# Patient Record
Sex: Female | Born: 1935
Health system: Southern US, Community
[De-identification: ages and names within clinical notes are randomized; demographics above are authoritative.]

## PROBLEM LIST (undated history)

## (undated) DIAGNOSIS — F32A Depression, unspecified: Secondary | ICD-10-CM

## (undated) DIAGNOSIS — F329 Major depressive disorder, single episode, unspecified: Secondary | ICD-10-CM

## (undated) DIAGNOSIS — M1990 Unspecified osteoarthritis, unspecified site: Secondary | ICD-10-CM

## (undated) DIAGNOSIS — I10 Essential (primary) hypertension: Secondary | ICD-10-CM

## (undated) DIAGNOSIS — E041 Nontoxic single thyroid nodule: Secondary | ICD-10-CM

## (undated) DIAGNOSIS — F419 Anxiety disorder, unspecified: Secondary | ICD-10-CM

## (undated) DIAGNOSIS — E785 Hyperlipidemia, unspecified: Secondary | ICD-10-CM

## (undated) DIAGNOSIS — I4891 Unspecified atrial fibrillation: Secondary | ICD-10-CM

## (undated) DIAGNOSIS — H9190 Unspecified hearing loss, unspecified ear: Secondary | ICD-10-CM

## (undated) DIAGNOSIS — K219 Gastro-esophageal reflux disease without esophagitis: Secondary | ICD-10-CM

## (undated) DIAGNOSIS — T7840XA Allergy, unspecified, initial encounter: Secondary | ICD-10-CM

## (undated) DIAGNOSIS — Z972 Presence of dental prosthetic device (complete) (partial): Secondary | ICD-10-CM

## (undated) DIAGNOSIS — E119 Type 2 diabetes mellitus without complications: Secondary | ICD-10-CM

## (undated) DIAGNOSIS — I509 Heart failure, unspecified: Secondary | ICD-10-CM

## (undated) HISTORY — DX: Essential (primary) hypertension: I10

## (undated) HISTORY — DX: Unspecified atrial fibrillation: I48.91

## (undated) HISTORY — DX: Depression, unspecified: F32.A

## (undated) HISTORY — DX: Allergy, unspecified, initial encounter: T78.40XA

## (undated) HISTORY — DX: Hyperlipidemia, unspecified: E78.5

## (undated) HISTORY — DX: Heart failure, unspecified: I50.9

## (undated) HISTORY — DX: Anxiety disorder, unspecified: F41.9

## (undated) HISTORY — DX: Gastro-esophageal reflux disease without esophagitis: K21.9

## (undated) HISTORY — DX: Major depressive disorder, single episode, unspecified: F32.9

---

## 2004-01-17 HISTORY — PX: COLONOSCOPY: SHX174

## 2004-05-14 ENCOUNTER — Emergency Department: Payer: Self-pay | Admitting: Emergency Medicine

## 2004-06-22 ENCOUNTER — Ambulatory Visit: Payer: Self-pay | Admitting: Gastroenterology

## 2004-11-01 ENCOUNTER — Ambulatory Visit: Payer: Self-pay | Admitting: Family Medicine

## 2005-02-08 ENCOUNTER — Other Ambulatory Visit: Payer: Self-pay

## 2005-02-08 ENCOUNTER — Emergency Department: Payer: Self-pay | Admitting: Emergency Medicine

## 2006-03-23 ENCOUNTER — Emergency Department: Payer: Self-pay | Admitting: Emergency Medicine

## 2006-04-02 ENCOUNTER — Emergency Department: Payer: Self-pay | Admitting: Emergency Medicine

## 2008-09-16 ENCOUNTER — Ambulatory Visit: Payer: Self-pay | Admitting: Otolaryngology

## 2008-10-06 ENCOUNTER — Ambulatory Visit: Payer: Self-pay | Admitting: Otolaryngology

## 2009-08-24 ENCOUNTER — Ambulatory Visit: Payer: Self-pay | Admitting: Family Medicine

## 2009-08-30 ENCOUNTER — Ambulatory Visit: Payer: Self-pay | Admitting: Family Medicine

## 2010-11-30 ENCOUNTER — Ambulatory Visit: Payer: Self-pay | Admitting: Family Medicine

## 2011-03-06 DIAGNOSIS — E041 Nontoxic single thyroid nodule: Secondary | ICD-10-CM | POA: Insufficient documentation

## 2011-03-06 DIAGNOSIS — Z87891 Personal history of nicotine dependence: Secondary | ICD-10-CM | POA: Insufficient documentation

## 2011-03-06 DIAGNOSIS — M19071 Primary osteoarthritis, right ankle and foot: Secondary | ICD-10-CM | POA: Insufficient documentation

## 2011-03-06 DIAGNOSIS — R42 Dizziness and giddiness: Secondary | ICD-10-CM | POA: Insufficient documentation

## 2011-03-06 DIAGNOSIS — E785 Hyperlipidemia, unspecified: Secondary | ICD-10-CM | POA: Insufficient documentation

## 2011-03-06 DIAGNOSIS — I480 Paroxysmal atrial fibrillation: Secondary | ICD-10-CM | POA: Insufficient documentation

## 2011-03-06 DIAGNOSIS — F5104 Psychophysiologic insomnia: Secondary | ICD-10-CM | POA: Insufficient documentation

## 2011-03-06 DIAGNOSIS — F32A Depression, unspecified: Secondary | ICD-10-CM | POA: Insufficient documentation

## 2013-12-16 ENCOUNTER — Ambulatory Visit: Payer: Self-pay

## 2014-01-01 ENCOUNTER — Emergency Department: Payer: Self-pay | Admitting: Emergency Medicine

## 2014-01-01 ENCOUNTER — Ambulatory Visit: Payer: Self-pay

## 2014-01-01 LAB — CBC
HCT: 41.2 % (ref 35.0–47.0)
HGB: 13.4 g/dL (ref 12.0–16.0)
MCH: 30.2 pg (ref 26.0–34.0)
MCHC: 32.6 g/dL (ref 32.0–36.0)
MCV: 93 fL (ref 80–100)
Platelet: 218 10*3/uL (ref 150–440)
RBC: 4.45 10*6/uL (ref 3.80–5.20)
RDW: 13.3 % (ref 11.5–14.5)
WBC: 9.1 10*3/uL (ref 3.6–11.0)

## 2014-01-01 LAB — BASIC METABOLIC PANEL
Anion Gap: 8 (ref 7–16)
BUN: 10 mg/dL (ref 7–18)
Calcium, Total: 8.9 mg/dL (ref 8.5–10.1)
Chloride: 103 mmol/L (ref 98–107)
Co2: 28 mmol/L (ref 21–32)
Creatinine: 0.83 mg/dL (ref 0.60–1.30)
EGFR (African American): >60
EGFR (Non-African Amer.): >60
Glucose: 119 mg/dL — ABNORMAL HIGH (ref 65–99)
Osmolality: 278 (ref 275–301)
Potassium: 3.9 mmol/L (ref 3.5–5.1)
Sodium: 139 mmol/L (ref 136–145)

## 2014-01-01 LAB — TROPONIN I: Troponin-I: <0.02 ng/mL

## 2014-01-01 LAB — TSH: Thyroid Stimulating Horm: 1.04 u[IU]/mL

## 2014-01-07 DIAGNOSIS — I5189 Other ill-defined heart diseases: Secondary | ICD-10-CM | POA: Insufficient documentation

## 2014-10-22 ENCOUNTER — Encounter: Payer: Self-pay | Admitting: Family Medicine

## 2014-10-22 ENCOUNTER — Ambulatory Visit (INDEPENDENT_AMBULATORY_CARE_PROVIDER_SITE_OTHER): Payer: Medicare Other | Admitting: Family Medicine

## 2014-10-22 VITALS — BP 110/62 | HR 64 | Ht 64.0 in | Wt 184.0 lb

## 2014-10-22 DIAGNOSIS — J0110 Acute frontal sinusitis, unspecified: Secondary | ICD-10-CM | POA: Diagnosis not present

## 2014-10-22 DIAGNOSIS — Z23 Encounter for immunization: Secondary | ICD-10-CM | POA: Diagnosis not present

## 2014-10-22 MED ORDER — AMOXICILLIN 500 MG PO CAPS: 500.0000 mg | ORAL_CAPSULE | Freq: Three times a day (TID) | ORAL | Status: DC

## 2014-10-22 NOTE — Progress Notes (Signed)
Name: Kristina Wagner   MRN: [id number]    DOB: 1934/11/09   Date:10/22/2014       Progress Note  Subjective  Chief Complaint  Chief Complaint  Patient presents with  . Sinusitis    head is full and has head pressure    Sinusitis This is a recurrent problem. The current episode started in the past 7 days. The problem has been gradually worsening since onset. There has been no fever. Her pain is at a severity of 2/10. The pain is mild. Associated symptoms include congestion, headaches, neck pain and sinus pressure. Pertinent negatives include no chills, coughing, diaphoresis, ear pain, hoarse voice, shortness of breath, sneezing, sore throat or swollen glands. Past treatments include acetaminophen and spray decongestants (mucinex d). The treatment provided no relief.    No problem-specific assessment & plan notes found for this encounter.   Past Medical History  Diagnosis Date  . Hyperlipidemia   . Hypertension   . Atrial fibrillation (HCC)   . GERD (gastroesophageal reflux disease)   . Depression   . Allergy   . Anxiety   . CHF (congestive heart failure) [facility])     Past Surgical History  Procedure Laterality Date  . Colonoscopy  2006    had 2 polyps- benign    Family History  Problem Relation Age of Onset  . Heart disease Mother     Social History   Social History  . Marital Status: Married    Spouse Name: N/A  . Number of Children: N/A  . Years of Education: N/A   Occupational History  . Not on file.   Social History Main Topics  . Smoking status: Former Games developer  . Smokeless tobacco: Not on file  . Alcohol Use: No  . Drug Use: No  . Sexual Activity: No   Other Topics Concern  . Not on file   Social History Narrative  . No narrative on file    No Known Allergies   Review of Systems  Constitutional: Negative for fever, chills, weight loss, malaise/fatigue and diaphoresis.  HENT: Positive for congestion and sinus pressure. Negative for ear  discharge, ear pain, hoarse voice, sneezing and sore throat.   Eyes: Negative for blurred vision.  Respiratory: Negative for cough, sputum production, shortness of breath and wheezing.   Cardiovascular: Negative for chest pain, palpitations and leg swelling.  Gastrointestinal: Negative for heartburn, nausea, abdominal pain, diarrhea, constipation, blood in stool and melena.  Genitourinary: Negative for dysuria, urgency, frequency and hematuria.  Musculoskeletal: Positive for neck pain. Negative for myalgias, back pain and joint pain.  Skin: Negative for rash.  Neurological: Positive for headaches. Negative for dizziness, tingling, sensory change and focal weakness.  Endo/Heme/Allergies: Negative for environmental allergies and polydipsia. Does not bruise/bleed easily.  Psychiatric/Behavioral: Negative for depression and suicidal ideas. The patient is not nervous/anxious and does not have insomnia.      Objective  Filed Vitals:   10/22/14 1537  BP: 110/62  Pulse: 64  Height:  (1.626 m)  Weight: 184 lb (83.462 kg)    Physical Exam  Constitutional: She is well-developed, well-nourished, and in no distress. No distress.  HENT:  Head: Normocephalic and atraumatic.  Right Ear: External ear normal.  Left Ear: External ear normal.  Nose: Nose normal.  Mouth/Throat: Oropharynx is clear and moist.  Eyes: Conjunctivae and EOM are normal. Pupils are equal, round, and reactive to light. Right eye exhibits no discharge. Left eye exhibits no discharge.  Neck: Normal range  of motion. Neck supple. No JVD present. No thyromegaly present.  Cardiovascular: Normal rate, regular rhythm, normal heart sounds and intact distal pulses.  Exam reveals no gallop and no friction rub.   No murmur heard. Pulmonary/Chest: Effort normal and breath sounds normal.  Abdominal: Soft. Bowel sounds are normal. She exhibits no mass. There is no tenderness. There is no guarding.  Musculoskeletal: Normal range of  motion. She exhibits no edema.  Lymphadenopathy:    She has no cervical adenopathy.  Neurological: She is alert. She has normal reflexes.  Skin: Skin is warm and dry. She is not diaphoretic.  Psychiatric: Mood and affect normal.  Nursing note and vitals reviewed.     Assessment & Plan  Problem List Items Addressed This Visit    None    Visit Diagnoses    Acute frontal sinusitis, recurrence not specified    -  Primary    nasonex sample    Relevant Medications    loratadine (CLARITIN) 10 MG tablet    amoxicillin (AMOXIL) 500 MG capsule    Need for influenza vaccination        Relevant Orders    Flu Vaccine QUAD 36+ mos PF IM (Fluarix & Fluzone Quad PF) (Completed)    Need for pneumococcal vaccination        Relevant Orders    Pneumococcal conjugate vaccine 13-valent (Completed)         Dr. Hayden [facility]  10/22/2014

## 2014-11-04 ENCOUNTER — Ambulatory Visit (INDEPENDENT_AMBULATORY_CARE_PROVIDER_SITE_OTHER): Payer: Medicare Other | Admitting: Family Medicine

## 2014-11-04 ENCOUNTER — Encounter: Payer: Self-pay | Admitting: Family Medicine

## 2014-11-04 VITALS — BP 110/62 | HR 84 | Ht 64.0 in | Wt 184.0 lb

## 2014-11-04 DIAGNOSIS — F329 Major depressive disorder, single episode, unspecified: Secondary | ICD-10-CM | POA: Diagnosis not present

## 2014-11-04 DIAGNOSIS — J302 Other seasonal allergic rhinitis: Secondary | ICD-10-CM

## 2014-11-04 DIAGNOSIS — R609 Edema, unspecified: Secondary | ICD-10-CM | POA: Diagnosis not present

## 2014-11-04 DIAGNOSIS — E785 Hyperlipidemia, unspecified: Secondary | ICD-10-CM | POA: Diagnosis not present

## 2014-11-04 DIAGNOSIS — I10 Essential (primary) hypertension: Secondary | ICD-10-CM | POA: Diagnosis not present

## 2014-11-04 DIAGNOSIS — F32A Depression, unspecified: Secondary | ICD-10-CM

## 2014-11-04 MED ORDER — LEVOCETIRIZINE DIHYDROCHLORIDE 5 MG PO TABS: 5.0000 mg | ORAL_TABLET | Freq: Every evening | ORAL | Status: DC

## 2014-11-04 MED ORDER — FUROSEMIDE 20 MG PO TABS: 20.0000 mg | ORAL_TABLET | Freq: Every day | ORAL | Status: DC

## 2014-11-04 MED ORDER — METOPROLOL TARTRATE 100 MG PO TABS: 100.0000 mg | ORAL_TABLET | Freq: Two times a day (BID) | ORAL | Status: DC

## 2014-11-04 MED ORDER — VENLAFAXINE HCL 75 MG PO TABS: 75.0000 mg | ORAL_TABLET | Freq: Every day | ORAL | Status: DC

## 2014-11-04 MED ORDER — LOSARTAN POTASSIUM 100 MG PO TABS: 100.0000 mg | ORAL_TABLET | Freq: Every day | ORAL | Status: DC

## 2014-11-04 MED ORDER — SIMVASTATIN 40 MG PO TABS: 40.0000 mg | ORAL_TABLET | Freq: Every day | ORAL | Status: DC

## 2014-11-04 NOTE — Progress Notes (Signed)
Name: Kristina Wagner   MRN: [id number]    DOB: 1934/10/26   Date:11/04/2014       Progress Note  Subjective  Chief Complaint  Chief Complaint  Patient presents with  . Hyperlipidemia  . Hypertension  . Depression  . Allergic Rhinitis     Hyperlipidemia This is a chronic problem. The current episode started more than 1 month ago. The problem is controlled. Recent lipid tests were reviewed and are normal. She has no history of chronic renal disease, diabetes, hypothyroidism, liver disease, obesity or nephrotic syndrome. Factors aggravating her hyperlipidemia include beta blockers. Pertinent negatives include no chest pain, focal sensory loss, focal weakness, leg pain, myalgias or shortness of breath. Current antihyperlipidemic treatment includes statins. The current treatment provides moderate improvement of lipids. There are no compliance problems.  Risk factors for coronary artery disease include diabetes mellitus and dyslipidemia.  Hypertension This is a chronic problem. The current episode started more than 1 year ago. The problem has been gradually improving since onset. The problem is controlled. Pertinent negatives include no anxiety, blurred vision, chest pain, headaches, malaise/fatigue, neck pain, orthopnea, palpitations, peripheral edema, PND, shortness of breath or sweats. There are no associated agents to hypertension. There are no known risk factors for coronary artery disease. Past treatments include beta blockers, calcium channel blockers and angiotensin blockers. The current treatment provides no improvement. There are no compliance problems.  There is no history of angina, kidney disease, CAD/MI, CVA, heart failure, left ventricular hypertrophy, PVD, renovascular disease or retinopathy. There is no history of chronic renal disease.  Depression        This is a chronic problem.  The current episode started more than 1 year ago.   The onset quality is gradual.   The problem occurs  intermittently.  The problem has been gradually improving since onset.  Associated symptoms include no decreased concentration, no fatigue, no helplessness, no hopelessness, does not have insomnia, not irritable, no myalgias, no headaches, not sad and no suicidal ideas.     The symptoms are aggravated by nothing.  Past treatments include SSRIs - Selective serotonin reuptake inhibitors.   Pertinent negatives include no hypothyroidism and no anxiety. Gastroesophageal Reflux She reports no abdominal pain, no chest pain, no coughing, no heartburn, no nausea, no sore throat or no wheezing. This is a chronic problem. The current episode started more than 1 year ago. Pertinent negatives include no fatigue, melena or weight loss. She has tried a histamine-2 antagonist for the symptoms.    No problem-specific assessment & plan notes found for this encounter.   Past Medical History  Diagnosis Date  . Hyperlipidemia   . Hypertension   . Atrial fibrillation (HCC)   . GERD (gastroesophageal reflux disease)   . Depression   . Allergy   . Anxiety   . CHF (congestive heart failure) [facility])     Past Surgical History  Procedure Laterality Date  . Colonoscopy  2006    had 2 polyps- benign    Family History  Problem Relation Age of Onset  . Heart disease Mother     Social History   Social History  . Marital Status: Married    Spouse Name: N/A  . Number of Children: N/A  . Years of Education: N/A   Occupational History  . Not on file.   Social History Main Topics  . Smoking status: Former Games developer  . Smokeless tobacco: Not on file  . Alcohol Use: No  . Drug Use:  No  . Sexual Activity: No   Other Topics Concern  . Not on file   Social History Narrative    No Known Allergies   Review of Systems  Constitutional: Negative for fever, chills, weight loss, malaise/fatigue and fatigue.  HENT: Negative for ear discharge, ear pain and sore throat.   Eyes: Negative for blurred vision.   Respiratory: Negative for cough, sputum production, shortness of breath and wheezing.   Cardiovascular: Negative for chest pain, palpitations, orthopnea, leg swelling and PND.  Gastrointestinal: Negative for heartburn, nausea, abdominal pain, diarrhea, constipation, blood in stool and melena.  Genitourinary: Negative for dysuria, urgency, frequency and hematuria.  Musculoskeletal: Negative for myalgias, back pain, joint pain and neck pain.  Skin: Negative for rash.  Neurological: Negative for dizziness, tingling, sensory change, focal weakness and headaches.  Endo/Heme/Allergies: Negative for environmental allergies and polydipsia. Does not bruise/bleed easily.  Psychiatric/Behavioral: Positive for depression. Negative for suicidal ideas and decreased concentration. The patient is not nervous/anxious and does not have insomnia.      Objective  Filed Vitals:   11/04/14 0915  BP: 110/62  Pulse: 84  Height:  (1.626 m)  Weight: 184 lb (83.462 kg)    Physical Exam  Constitutional: She is well-developed, well-nourished, and in no distress. She is not irritable. No distress.  HENT:  Head: Normocephalic and atraumatic.  Right Ear: External ear normal.  Left Ear: External ear normal.  Nose: Nose normal.  Mouth/Throat: Oropharynx is clear and moist.  Eyes: Conjunctivae and EOM are normal. Pupils are equal, round, and reactive to light. Right eye exhibits no discharge. Left eye exhibits no discharge.  Neck: Normal range of motion. Neck supple. No JVD present. No thyromegaly present.  Cardiovascular: Normal rate, regular rhythm, normal heart sounds and intact distal pulses.  Exam reveals no gallop and no friction rub.   No murmur heard. Pulmonary/Chest: Effort normal and breath sounds normal.  Abdominal: Soft. Bowel sounds are normal. She exhibits no mass. There is no tenderness. There is no guarding.  Musculoskeletal: Normal range of motion. She exhibits no edema.  Lymphadenopathy:     She has no cervical adenopathy.  Neurological: She is alert. She has normal reflexes.  Skin: Skin is warm and dry. She is not diaphoretic.  Psychiatric: Mood and affect normal.      Assessment & Plan  Problem List Items Addressed This Visit    None    Visit Diagnoses    Essential hypertension    -  Primary    Relevant Medications    simvastatin (ZOCOR) 40 MG tablet    losartan (COZAAR) 100 MG tablet    furosemide (LASIX) 20 MG tablet    metoprolol (LOPRESSOR) 100 MG tablet    Other Relevant Orders    Renal Function Panel    Other seasonal allergic rhinitis        Relevant Medications    levocetirizine (XYZAL) 5 MG tablet    Other Relevant Orders    Lipid Profile    Hyperlipidemia        Relevant Medications    simvastatin (ZOCOR) 40 MG tablet    losartan (COZAAR) 100 MG tablet    furosemide (LASIX) 20 MG tablet    metoprolol (LOPRESSOR) 100 MG tablet    Depression        Relevant Medications    venlafaxine (EFFEXOR) 75 MG tablet    Edema, unspecified type        Relevant Medications    furosemide (LASIX)  20 MG tablet         Dr. Hayden [facility]  11/04/2014

## 2014-11-05 LAB — RENAL FUNCTION PANEL
Albumin: 4.2 g/dL (ref 3.5–4.8)
BUN/Creatinine Ratio: 21 (ref 11–26)
BUN: 15 mg/dL (ref 8–27)
CO2: 26 mmol/L (ref 18–29)
Calcium: 9.4 mg/dL (ref 8.7–10.3)
Chloride: 96 mmol/L — ABNORMAL LOW (ref 97–106)
Creatinine, Ser: 0.72 mg/dL (ref 0.57–1.00)
GFR calc Af Amer: 93 mL/min/{1.73_m2} (ref >59)
GFR calc non Af Amer: 80 mL/min/{1.73_m2} (ref >59)
Glucose: 151 mg/dL — ABNORMAL HIGH (ref 65–99)
Phosphorus: 3.9 mg/dL (ref 2.5–4.5)
Potassium: 4.7 mmol/L (ref 3.5–5.2)
Sodium: 139 mmol/L (ref 136–144)

## 2014-11-05 LAB — LIPID PANEL
Chol/HDL Ratio: 3.2 ratio units (ref 0.0–4.4)
Cholesterol, Total: 193 mg/dL (ref 100–199)
HDL: 61 mg/dL (ref >39)
LDL Calculated: 103 mg/dL — ABNORMAL HIGH (ref 0–99)
Triglycerides: 146 mg/dL (ref 0–149)
VLDL Cholesterol Cal: 29 mg/dL (ref 5–40)

## 2014-11-24 ENCOUNTER — Other Ambulatory Visit: Payer: Self-pay

## 2014-11-24 DIAGNOSIS — J019 Acute sinusitis, unspecified: Secondary | ICD-10-CM

## 2014-11-24 MED ORDER — AMOXICILLIN 500 MG PO CAPS: 500.0000 mg | ORAL_CAPSULE | Freq: Three times a day (TID) | ORAL | Status: DC

## 2015-02-15 ENCOUNTER — Encounter: Payer: Self-pay | Admitting: Family Medicine

## 2015-02-15 ENCOUNTER — Ambulatory Visit (INDEPENDENT_AMBULATORY_CARE_PROVIDER_SITE_OTHER): Payer: PPO | Admitting: Family Medicine

## 2015-02-15 VITALS — BP 124/70 | HR 78 | Ht 64.0 in | Wt 191.0 lb

## 2015-02-15 DIAGNOSIS — S46111A Strain of muscle, fascia and tendon of long head of biceps, right arm, initial encounter: Secondary | ICD-10-CM | POA: Diagnosis not present

## 2015-02-15 DIAGNOSIS — Z1231 Encounter for screening mammogram for malignant neoplasm of breast: Secondary | ICD-10-CM

## 2015-02-15 DIAGNOSIS — S46211A Strain of muscle, fascia and tendon of other parts of biceps, right arm, initial encounter: Secondary | ICD-10-CM

## 2015-02-15 NOTE — Patient Instructions (Signed)
Biceps Tendon Disruption (Proximal) With Rehab The biceps tendon attaches the biceps muscle to the bones of the elbow and the shoulder. A proximal biceps tendon disruption is a tear of the long head of the tendon that attaches near the shoulder (more common) or a tear in the muscle near the muscle tendon junction (less common). These injuries usually involve a complete tear of the tendon from the bone. However, partial tears are also possible. The biceps muscle works with other muscles to bend the elbow and rotate the palm upward (supinate). A complete biceps rupture will result in about a 10% decrease in elbow bending strength and a 20% decrease in your ability to turn the palm upward, using the wrist. SYMPTOMS   Pain, tenderness, swelling, warmth, or redness over the front of the shoulder.  Pain that gets worse with shoulder and elbow use, especially against resistance (lifting or carrying).  Bruising (contusion) in the arm or elbow after 24 to 48 hours.  Bulge can be seen and felt in the arm.  Limited motion of the shoulder or elbow.  Weakness with attempted elbow bending or rotation of the wrist, such as when using a screwdriver.  Crackling sound (crepitation) when the tendon or shoulder is moved or touched. CAUSES  A biceps tendon rupture occurs when the tendon is subjected to a force that is greater than it can withstand. For example, a sudden force straightening the elbow while the biceps is flexed, or a direct hit (trauma) (rare). RISK INCREASES WITH:   Sports that involve contact, or throwing and overhead activities (racquet sports, gymnastics, weightlifting, bodybuilding).  Heavy labor.  Poor strength and flexibility.  Failure to warm up properly before activity. PREVENTION  Warm up and stretch properly before activity.  Maintain physical fitness:  Strength, flexibility, and endurance.  Cardiovascular fitness.  Allow your body to recover between practices and  competition.  Learn and use proper exercise technique. PROGNOSIS  If treated properly, the symptoms of a proximal biceps tendon disruption usually go away within 12 weeks of injury.  RELATED COMPLICATIONS  Longer healing time if not properly treated, or if not given enough time to heal.  Recurring symptoms, especially if activity is resumed too soon.  Weakness of elbow bending and forearm rotation.  Prolonged disability (uncommon). TREATMENT Treatment first involves the use of ice and medicine, to reduce pain and inflammation. It is also important to perform strengthening and stretching exercises and to modify activities that cause symptoms to get worse. These exercises may be performed at home or with a therapist. It is not possible to surgically fix the tendon (sew it together). However, surgery may be performed to reinsert the tendon into the arm bone. This will make the arm look normal again. Surgery is most often advised for younger, active individuals, especially those who require strength of wrist rotation.  MEDICATION  If pain medicine is needed, nonsteroidal anti-inflammatory medicines (aspirin and ibuprofen), or other minor pain relievers (acetaminophen), are often advised.  Do not take pain medicine for 7 days before surgery.  Prescription pain relievers may be given if your caregiver thinks they are needed. Use only as directed and only as much as you need.  Corticosteroid injections may be given to help reduce inflammation, but are not usually recommended for this injury. HEAT AND COLD  Cold treatment (icing) should be applied for 10 to 15 minutes every 2 to 3 hours for inflammation and pain, and immediately after activity that aggravates your symptoms. Use ice packs   or an ice massage.  Heat treatment may be used before performing stretching and strengthening activities prescribed by your caregiver, physical therapist, or athletic trainer. Use a heat pack or a warm water  soak. SEEK MEDICAL CARE IF:   Symptoms get worse or do not improve in 2 weeks, despite treatment.  New, unexplained symptoms develop. (Drugs used in treatment may produce side effects.) EXERCISES RANGE OF MOTION (ROM) AND STRETCHING EXERCISES - Biceps Tendon Disruption (Proximal) These exercises may help you when beginning to rehabilitate your injury. Your symptoms may go away with or without further involvement from your physician, physical therapist or athletic trainer. While completing these exercises, remember:   Restoring tissue flexibility helps normal motion to return to the joints. This allows healthier, less painful movement and activity.  An effective stretch should be held for at least 30 seconds.  A stretch should never be painful. You should only feel a gentle lengthening or release in the stretched tissue. STRETCH - Flexion, Standing  Stand with good posture. With an underhand grip on your right / left hand and an overhand grip on the opposite hand, grasp a broomstick or cane so that your hands are a little more than shoulder width apart.  Keeping your right / left elbow straight and shoulder muscles relaxed, push the stick with your opposite hand to raise your right / left arm in front of your body and then overhead. Raise your arm until you feel a stretch in your right / left shoulder, but before you have increased shoulder pain.  Try to avoid shrugging your right / left shoulder as your arm rises by keeping your shoulder blade tucked down and toward your mid-back spine. Hold __________ seconds.  Slowly return to the starting position. Repeat __________ times. Complete this exercise __________ times per day. STRETCH - Abduction, Supine  Stand with good posture. With an underhand grip on your right / left hand and an overhand grip on the opposite hand, grasp a broomstick or cane so that your hands are a little more than shoulder-width apart.  Keeping your right / left  elbow straight and shoulder muscles relaxed, push the stick with your opposite hand to raise your right / left arm out to the side of your body and then overhead. Raise your arm until you feel a stretch in your right / left shoulder, but before you have increased shoulder pain.  Try to avoid shrugging your right / left shoulder as your arm rises, by keeping your shoulder blade tucked down and toward your mid-back spine. Hold for __________ seconds.  Slowly return to the starting position. Repeat __________ times. Complete this exercise __________ times per day. ROM - Flexion, Active-Assisted  Lie on your back. You may bend your knees for comfort.  Grasp a broomstick or cane so your hands are about shoulder width apart. Your right / left hand should grip the end of the stick so that your hand is positioned "thumbs-up," as if you were about to shake hands.  Using your healthy arm to lead, raise your right / left arm overhead until you feel a gentle stretch in your shoulder. Hold for right / left seconds.  Use the stick to assist in returning your right / left arm to its starting position. Repeat __________ times. Complete this exercise __________ times per day.  STRETCH - Flexion, Standing   Stand facing a wall. Walk your right / left fingers up the wall until you feel a moderate stretch in your   shoulder. As your hand gets higher, you may need to step closer to the wall or use a door frame to walk through.  Try to avoid shrugging your right / left shoulder as your arm rises, by keeping your shoulder blade tucked down and toward your mid-back spine.  Hold for __________ seconds. Use your other hand, if needed, to ease out of the stretch and return to the starting position. Repeat __________ times. Complete this exercise __________ times per day.  ROM - Internal Rotation   Using underhand grips, grasp a stick behind your back with both hands.  While standing upright with good posture, slide  the stick up your back until you feel a mild stretch in the front of your shoulder.  Hold for __________ seconds. Slowly return to your starting position. Repeat __________ times. Complete this exercise __________ times per day.  STRETCH - Internal Rotation  Place your right / left hand behind your back, palm-up.  Throw a towel or belt over your opposite shoulder. Grasp the towel with your right / left hand.  While keeping an upright posture, gently pull up on the towel until you feel a stretch in the front of your right / left shoulder.  Avoid shrugging your right / left shoulder as your arm rises, by keeping your shoulder blade tucked down and toward your mid-back spine.  Hold for __________ seconds. Release the stretch by lowering your opposite hand. Repeat __________ times. Complete this exercise __________ times per day. STRETCH - Elbow Flexors   Lie on a firm bed or countertop on your back. Be sure that you are in a comfortable position which will allow you to relax your arm muscles.  Place a folded towel under your right / left upper arm, so that your elbow and shoulder are at the same height. Extend your arm; your elbow should not rest on the bed or towel.  Allow the weight of your hand to straighten your elbow. Keep your arm and chest muscles relaxed. Your caregiver may ask you to increase the intensity of your stretch by adding a small wrist or hand weight.  Hold for __________ seconds. You should feel a stretch on the inside of your elbow. Slowly return to the starting position. Repeat __________ times. Complete this exercise __________ times per day. STRENGTHENING EXERCISES - Biceps Tendon Disruption (Proximal) These exercises may help you regain your strength after your physician has discontinued your restraint in a cast or brace. They may resolve your symptoms with or without further involvement from your physician, physical therapist or athletic trainer. While completing  these exercises, remember:   Muscles can gain both the endurance and the strength needed for everyday activities through controlled exercises.  Complete these exercises as instructed by your physician, physical therapist or athletic trainer. Progress the resistance and repetitions only as guided.  You may experience muscle soreness or fatigue, but the pain or discomfort you are trying to eliminate should never worsen during these exercises. If this pain does get worse, stop and make sure you are following the directions exactly. If the pain is still present after adjustments, discontinue the exercise until you can discuss the trouble with your clinician. STRENGTH - Elbow Flexors, Isometric   Stand or sit upright on a firm surface. Place your right / left arm so that your hand is palm-up and at the height of your waist.  Place your opposite hand on top of your forearm. Gently push down as your right / left arm   resists. Push as hard as you can with both arms, without causing any pain or movement at your right / left elbow. Hold this stationary position for __________ seconds.  Gradually release the tension in both arms. Allow your muscles to relax completely before repeating. Repeat __________ times. Complete this exercise __________ times per day. STRENGTH - Shoulder Flexion, Isometric  With good posture and facing a wall, stand or sit about 4-6 inches away.  Keeping your right / left elbow straight, gently press the top of your fist into the wall. Increase the pressure gradually until you are pressing as hard as you can without shrugging your shoulder or increasing any shoulder discomfort.  Hold for __________ seconds.  Release the tension slowly. Relax your shoulder muscles completely before you the next repetition. Repeat __________ times. Complete this exercise __________ times per day.  STRENGTH - Elbow Flexors, Supinated  With good posture, stand or sit on a firm chair without  armrests. Allow your right / left arm to rest at your side with your palm facing forward.  Holding a __________weight or gripping a rubber exercise band or tubing, bring your hand toward your shoulder.  Allow your muscles to control the resistance as your hand returns to your side. Repeat __________ times. Complete this exercise __________ times per day.  STRENGTH - Elbow Flexors, Neutral  With good posture, stand or sit on a firm chair without armrests. Allow your right / left arm to rest at your side with your thumb facing forward.  Holding a __________ weight or gripping a rubber exercise band or tubing, bring your hand toward your shoulder.  Allow your muscles to control the resistance as your hand returns to your side. Repeat __________ times. Complete this exercise __________ times per day.  STRENGTH - Shoulder Flexion   Stand or sit with good posture. Grasp a __________ weight, or an exercise band or tubing, so that your hand is "thumbs-up," like when you shake hands.  Slowly lift your right / left arm as far as you can without increasing any shoulder pain. Initially, many people can only raise their hand to shoulder height.  Avoid shrugging your right / left shoulder as your arm rises, by keeping your shoulder blade tucked down and toward your mid-back spine.  Hold for __________ seconds. Control the descent of your hand as you slowly return to your starting position. Repeat __________ times. Complete this exercise __________ times per day.  STRENGTH - Forearm Supinators   Sit with your right / left forearm supported on a table, keeping your elbow below shoulder height. Rest your hand over the edge, palm down.  Gently grip a hammer or a soup ladle.  Without moving your elbow, slowly turn your palm and hand upward to a "thumbs-up" position.  Hold this position for __________ seconds. Slowly return to the starting position. Repeat __________ times. Complete this exercise  __________ times per day.  STRENGTH - Forearm Pronators   Sit with your right / left forearm supported on a table, keeping your elbow below shoulder height. Rest your hand over the edge, palm up.  Gently grip a hammer or a soup ladle.  Without moving your elbow, slowly turn your palm and hand upward to a "thumbs-up" position.  Hold this position for __________ seconds. Slowly return to the starting position. Repeat __________ times. Complete this exercise __________ times per day.    This information is not intended to replace advice given to you by your health care provider. Make   sure you discuss any questions you have with your health care provider.   Document Released: 01/02/2005 Document Revised: 01/23/2014 Document Reviewed: 04/16/2008 Elsevier Interactive Patient Education 2016 [employer]  

## 2015-02-15 NOTE — Progress Notes (Signed)
Name: Kristina Wagner   MRN: [id number]    DOB: 09/21/1934   Date:02/15/2015       Progress Note  Subjective  Chief Complaint  Chief Complaint  Patient presents with  . Bleeding/Bruising    "was pulling on husband and felt something pop"    Arm Injury  The incident occurred 3 to 5 days ago. The incident occurred at home. Injury mechanism: pulling on immovable circumstance. The pain is present in the upper right arm. The quality of the pain is described as aching. The pain is at a severity of 2/10. The pain is moderate. The pain has been fluctuating since the incident. Pertinent negatives include no chest pain, muscle weakness, numbness or tingling. Nothing aggravates the symptoms. She has tried NSAIDs for the symptoms. The treatment provided no relief.    No problem-specific assessment & plan notes found for this encounter.   Past Medical History  Diagnosis Date  . Hyperlipidemia   . Hypertension   . Atrial fibrillation (HCC)   . GERD (gastroesophageal reflux disease)   . Depression   . Allergy   . Anxiety   . CHF (congestive heart failure) [facility])     Past Surgical History  Procedure Laterality Date  . Colonoscopy  2006    had 2 polyps- benign    Family History  Problem Relation Age of Onset  . Heart disease Mother     Social History   Social History  . Marital Status: Married    Spouse Name: N/A  . Number of Children: N/A  . Years of Education: N/A   Occupational History  . Not on file.   Social History Main Topics  . Smoking status: Former Games developer  . Smokeless tobacco: Not on file  . Alcohol Use: No  . Drug Use: No  . Sexual Activity: No   Other Topics Concern  . Not on file   Social History Narrative    No Known Allergies   Review of Systems  Constitutional: Negative for fever, chills, weight loss and malaise/fatigue.  HENT: Negative for ear discharge, ear pain and sore throat.   Eyes: Negative for blurred vision.  Respiratory: Negative for  cough, sputum production, shortness of breath and wheezing.   Cardiovascular: Negative for chest pain, palpitations and leg swelling.  Gastrointestinal: Negative for heartburn, nausea, abdominal pain, diarrhea, constipation, blood in stool and melena.  Genitourinary: Negative for dysuria, urgency, frequency and hematuria.  Musculoskeletal: Negative for myalgias, back pain, joint pain and neck pain.  Skin: Negative for rash.  Neurological: Negative for dizziness, tingling, sensory change, focal weakness, numbness and headaches.  Endo/Heme/Allergies: Negative for environmental allergies and polydipsia. Does not bruise/bleed easily.  Psychiatric/Behavioral: Negative for depression and suicidal ideas. The patient is not nervous/anxious and does not have insomnia.      Objective  Filed Vitals:   02/15/15 1336  BP: 124/70  Pulse: 78  Height:  (1.626 m)  Weight: 191 lb (86.637 kg)    Physical Exam  Constitutional: She is well-developed, well-nourished, and in no distress. No distress.  HENT:  Head: Normocephalic and atraumatic.  Right Ear: External ear normal.  Left Ear: External ear normal.  Nose: Nose normal.  Mouth/Throat: Oropharynx is clear and moist.  Eyes: Conjunctivae and EOM are normal. Pupils are equal, round, and reactive to light. Right eye exhibits no discharge. Left eye exhibits no discharge.  Neck: Normal range of motion. Neck supple. No JVD present. No thyromegaly present.  Cardiovascular: Normal rate, regular  rhythm, normal heart sounds and intact distal pulses.  Exam reveals no gallop and no friction rub.   No murmur heard. Pulmonary/Chest: Effort normal and breath sounds normal.  Abdominal: Soft. Bowel sounds are normal. She exhibits no mass. There is no tenderness. There is no guarding.  Musculoskeletal: Normal range of motion. She exhibits no edema.       Right upper arm: She exhibits tenderness, swelling and deformity.       Arms: ecchymosis    Lymphadenopathy:    She has no cervical adenopathy.  Neurological: She is alert. She has normal reflexes.  Skin: Skin is warm and dry. She is not diaphoretic.  Psychiatric: Mood and affect normal.  Nursing note and vitals reviewed.     Assessment & Plan  Problem List Items Addressed This Visit    None    Visit Diagnoses    Biceps muscle tear, right, initial encounter    -  Primary    proximal tendon rupture    Relevant Orders    Ambulatory referral to Orthopedic Surgery    Visit for screening mammogram        Relevant Orders    MM Digital Screening         Dr. Elizabeth Sauer [facility]  02/15/2015

## 2015-02-16 DIAGNOSIS — S46111A Strain of muscle, fascia and tendon of long head of biceps, right arm, initial encounter: Secondary | ICD-10-CM | POA: Diagnosis not present

## 2015-02-22 ENCOUNTER — Other Ambulatory Visit: Payer: Self-pay

## 2015-02-22 DIAGNOSIS — I10 Essential (primary) hypertension: Secondary | ICD-10-CM

## 2015-02-22 DIAGNOSIS — F329 Major depressive disorder, single episode, unspecified: Secondary | ICD-10-CM

## 2015-02-22 DIAGNOSIS — F32A Depression, unspecified: Secondary | ICD-10-CM

## 2015-02-22 MED ORDER — VENLAFAXINE HCL ER 75 MG PO CP24: 75.0000 mg | ORAL_CAPSULE | Freq: Every day | ORAL | Status: DC

## 2015-02-22 MED ORDER — LOSARTAN POTASSIUM 100 MG PO TABS: 100.0000 mg | ORAL_TABLET | Freq: Every day | ORAL | Status: DC

## 2015-02-24 ENCOUNTER — Ambulatory Visit
Admission: RE | Admit: 2015-02-24 | Discharge: 2015-02-24 | Disposition: A | Discharge status: Home / Self Care | Payer: PPO | Source: Ambulatory Visit | Attending: Family Medicine | Admitting: Family Medicine

## 2015-02-24 DIAGNOSIS — Z1231 Encounter for screening mammogram for malignant neoplasm of breast: Secondary | ICD-10-CM | POA: Diagnosis not present

## 2015-03-09 DIAGNOSIS — I519 Heart disease, unspecified: Secondary | ICD-10-CM | POA: Diagnosis not present

## 2015-03-09 DIAGNOSIS — I480 Paroxysmal atrial fibrillation: Secondary | ICD-10-CM | POA: Diagnosis not present

## 2015-06-02 DIAGNOSIS — F411 Generalized anxiety disorder: Secondary | ICD-10-CM | POA: Diagnosis not present

## 2015-06-02 DIAGNOSIS — F4322 Adjustment disorder with anxiety: Secondary | ICD-10-CM | POA: Diagnosis not present

## 2015-06-16 DIAGNOSIS — F411 Generalized anxiety disorder: Secondary | ICD-10-CM | POA: Diagnosis not present

## 2015-06-16 DIAGNOSIS — F4322 Adjustment disorder with anxiety: Secondary | ICD-10-CM | POA: Diagnosis not present

## 2015-09-06 ENCOUNTER — Other Ambulatory Visit: Payer: Self-pay

## 2015-09-06 DIAGNOSIS — I10 Essential (primary) hypertension: Secondary | ICD-10-CM

## 2015-09-06 MED ORDER — LOSARTAN POTASSIUM 100 MG PO TABS: 100.0000 mg | ORAL_TABLET | Freq: Every day | ORAL | 0 refills | Status: DC

## 2015-09-07 DIAGNOSIS — E785 Hyperlipidemia, unspecified: Secondary | ICD-10-CM | POA: Diagnosis not present

## 2015-09-07 DIAGNOSIS — I10 Essential (primary) hypertension: Secondary | ICD-10-CM | POA: Diagnosis not present

## 2015-09-07 DIAGNOSIS — I480 Paroxysmal atrial fibrillation: Secondary | ICD-10-CM | POA: Diagnosis not present

## 2015-09-07 DIAGNOSIS — I519 Heart disease, unspecified: Secondary | ICD-10-CM | POA: Diagnosis not present

## 2015-10-13 ENCOUNTER — Ambulatory Visit (INDEPENDENT_AMBULATORY_CARE_PROVIDER_SITE_OTHER): Payer: PPO | Admitting: Family Medicine

## 2015-10-13 ENCOUNTER — Encounter: Payer: Self-pay | Admitting: Family Medicine

## 2015-10-13 VITALS — BP 120/70 | HR 60 | Ht 64.0 in | Wt 185.0 lb

## 2015-10-13 DIAGNOSIS — R609 Edema, unspecified: Secondary | ICD-10-CM

## 2015-10-13 DIAGNOSIS — I10 Essential (primary) hypertension: Secondary | ICD-10-CM

## 2015-10-13 DIAGNOSIS — J301 Allergic rhinitis due to pollen: Secondary | ICD-10-CM

## 2015-10-13 DIAGNOSIS — J302 Other seasonal allergic rhinitis: Secondary | ICD-10-CM

## 2015-10-13 DIAGNOSIS — E785 Hyperlipidemia, unspecified: Secondary | ICD-10-CM | POA: Diagnosis not present

## 2015-10-13 DIAGNOSIS — F329 Major depressive disorder, single episode, unspecified: Secondary | ICD-10-CM | POA: Diagnosis not present

## 2015-10-13 DIAGNOSIS — F32A Depression, unspecified: Secondary | ICD-10-CM

## 2015-10-13 MED ORDER — SIMVASTATIN 40 MG PO TABS: 40.0000 mg | ORAL_TABLET | Freq: Every day | ORAL | 1 refills | Status: DC

## 2015-10-13 MED ORDER — VENLAFAXINE HCL ER 75 MG PO CP24: 75.0000 mg | ORAL_CAPSULE | Freq: Every day | ORAL | 1 refills | Status: DC

## 2015-10-13 MED ORDER — METOPROLOL TARTRATE 100 MG PO TABS
100.0000 mg | ORAL_TABLET | Freq: Two times a day (BID) | ORAL | 1 refills | Status: DC
Start: 2015-10-13 — End: 2016-02-29

## 2015-10-13 MED ORDER — FISH OIL 1200 MG PO CAPS: 1.0000 | ORAL_CAPSULE | Freq: Every day | ORAL | 11 refills | Status: DC

## 2015-10-13 MED ORDER — LOSARTAN POTASSIUM 100 MG PO TABS: 100.0000 mg | ORAL_TABLET | Freq: Every day | ORAL | 0 refills | Status: DC

## 2015-10-13 MED ORDER — LEVOCETIRIZINE DIHYDROCHLORIDE 5 MG PO TABS: 5.0000 mg | ORAL_TABLET | Freq: Every evening | ORAL | 6 refills | Status: DC

## 2015-10-13 MED ORDER — FUROSEMIDE 20 MG PO TABS: 20.0000 mg | ORAL_TABLET | Freq: Every day | ORAL | 1 refills | Status: DC

## 2015-10-13 NOTE — Progress Notes (Signed)
Name: Kristina Wagner   MRN: [id number]    DOB: [birth year]   Date:10/13/2015       Progress Note  Subjective  Chief Complaint  Chief Complaint  Patient presents with  . Hypertension  . Hyperlipidemia  . Depression  . Allergic Rhinitis     Hypertension  This is a chronic problem. The current episode started more than 1 year ago. The problem has been gradually improving since onset. The problem is controlled. Associated symptoms include headaches. Pertinent negatives include no anxiety, blurred vision, chest pain, malaise/fatigue, neck pain, orthopnea, palpitations, peripheral edema, PND, shortness of breath or sweats. There are no associated agents to hypertension. There are no known risk factors for coronary artery disease. Past treatments include beta blockers and angiotensin blockers. There is no history of angina, kidney disease, CAD/MI, CVA, heart failure, left ventricular hypertrophy, PVD, renovascular disease, retinopathy or a thyroid problem. There is no history of chronic renal disease or a hypertension causing med.  Hyperlipidemia  This is a chronic problem. The current episode started more than 1 year ago. The problem is controlled. Recent lipid tests were reviewed and are normal. She has no history of chronic renal disease, diabetes, hypothyroidism, liver disease, obesity or nephrotic syndrome. Pertinent negatives include no chest pain, focal sensory loss, focal weakness, leg pain, myalgias or shortness of breath. The current treatment provides moderate improvement of lipids. There are no compliance problems.   Depression       The patient presents with depression.  This is a chronic problem.  The current episode started more than 1 year ago.   The onset quality is gradual.   The problem has been gradually improving since onset.  Associated symptoms include headaches.  Associated symptoms include does not have insomnia, no myalgias and no suicidal ideas.     The symptoms are aggravated  by family issues.  Past treatments include SNRIs - Serotonin and norepinephrine reuptake inhibitors.  Compliance with treatment is good.  Previous treatment provided moderate relief.  Past medical history includes depression.     Pertinent negatives include no chronic pain, no hypothyroidism, no thyroid problem, no anxiety and no suicide attempts. URI   This is a recurrent problem. The problem has been gradually worsening. There has been no fever. Associated symptoms include congestion, headaches, a plugged ear sensation and rhinorrhea. Pertinent negatives include no abdominal pain, chest pain, coughing, diarrhea, dysuria, ear pain, joint pain, nausea, neck pain, rash, sore throat or wheezing. Treatments tried: xyzol. The treatment provided moderate relief.    No problem-specific Assessment & Plan notes found for this encounter.   Past Medical History:  Diagnosis Date  . Allergy   . Anxiety   . Atrial fibrillation (HCC)   . CHF (congestive heart failure) (HCC)   . Depression   . GERD (gastroesophageal reflux disease)   . Hyperlipidemia   . Hypertension     Past Surgical History:  Procedure Laterality Date  . COLONOSCOPY  2006   had 2 polyps- benign    Family History  Problem Relation Age of Onset  . Heart disease Mother     Social History   Social History  . Marital status: Married    Spouse name: N/A  . Number of children: N/A  . Years of education: N/A   Occupational History  . Not on file.   Social History Main Topics  . Smoking status: Former Games developer  . Smokeless tobacco: Not on file  . Alcohol use No  .  Drug use: No  . Sexual activity: No   Other Topics Concern  . Not on file   Social History Narrative  . No narrative on file    No Known Allergies   Review of Systems  Constitutional: Negative for chills, fever, malaise/fatigue and weight loss.  HENT: Positive for congestion and rhinorrhea. Negative for ear discharge, ear pain and sore throat.   Eyes:  Negative for blurred vision.  Respiratory: Negative for cough, sputum production, shortness of breath and wheezing.   Cardiovascular: Negative for chest pain, palpitations, orthopnea, leg swelling and PND.  Gastrointestinal: Negative for abdominal pain, blood in stool, constipation, diarrhea, heartburn, melena and nausea.  Genitourinary: Negative for dysuria, frequency, hematuria and urgency.  Musculoskeletal: Negative for back pain, joint pain, myalgias and neck pain.  Skin: Negative for rash.  Neurological: Positive for headaches. Negative for dizziness, tingling, sensory change and focal weakness.  Endo/Heme/Allergies: Negative for environmental allergies and polydipsia. Does not bruise/bleed easily.  Psychiatric/Behavioral: Positive for depression. Negative for suicidal ideas. The patient is not nervous/anxious and does not have insomnia.      Objective  Vitals:   10/13/15 1013  BP: 120/70  Pulse: 60  Weight: 185 lb (83.9 kg)  Height: 5\' 4"  (1.626 m)    Physical Exam  Constitutional: She is well-developed, well-nourished, and in no distress. No distress.  HENT:  Head: Normocephalic and atraumatic.  Right Ear: External ear normal.  Left Ear: External ear normal.  Nose: Mucosal edema and rhinorrhea present.  Mouth/Throat: Oropharynx is clear and moist.  Eyes: Conjunctivae and EOM are normal. Pupils are equal, round, and reactive to light. Right eye exhibits no discharge. Left eye exhibits no discharge.  Neck: Normal range of motion. Neck supple. No JVD present. No thyromegaly present.  Cardiovascular: Normal rate, regular rhythm, normal heart sounds and intact distal pulses.  Exam reveals no gallop and no friction rub.   No murmur heard. Pulmonary/Chest: Effort normal and breath sounds normal. She has no wheezes. She has no rales.  Abdominal: Soft. Bowel sounds are normal. She exhibits no mass. There is no tenderness. There is no guarding.  Musculoskeletal: Normal range of  motion. She exhibits no edema.  Lymphadenopathy:    She has no cervical adenopathy.  Neurological: She is alert.  Skin: Skin is warm and dry. She is not diaphoretic.  Psychiatric: Mood and affect normal.  Nursing note and vitals reviewed.     Assessment & Plan  Problem List Items Addressed This Visit      Other   Hyperlipidemia   Relevant Medications   metoprolol (LOPRESSOR) 100 MG tablet   losartan (COZAAR) 100 MG tablet   simvastatin (ZOCOR) 40 MG tablet   furosemide (LASIX) 20 MG tablet   Other Relevant Orders   Lipid Profile    Other Visit Diagnoses    Essential hypertension    -  Primary   Relevant Medications   metoprolol (LOPRESSOR) 100 MG tablet   losartan (COZAAR) 100 MG tablet   simvastatin (ZOCOR) 40 MG tablet   furosemide (LASIX) 20 MG tablet   Other Relevant Orders   Renal function panel   Allergic rhinitis due to pollen       Depression       Relevant Medications   venlafaxine XR (EFFEXOR-XR) 75 MG 24 hr capsule   Other seasonal allergic rhinitis       Relevant Medications   levocetirizine (XYZAL) 5 MG tablet   Edema, unspecified type  Relevant Medications   furosemide (LASIX) 20 MG tablet     I spent 25 minutes with this patient, More than 50% of that time was spent in face to face education, counseling and care coordination.  Dr. Hayden Rasmusseneanna Shalicia [facility]  10/13/15

## 2015-10-14 LAB — RENAL FUNCTION PANEL
Albumin: 4.4 g/dL (ref 3.5–4.8)
BUN/Creatinine Ratio: 21 (ref 12–28)
BUN: 16 mg/dL (ref 8–27)
CO2: 24 mmol/L (ref 18–29)
Calcium: 9.3 mg/dL (ref 8.7–10.3)
Chloride: 97 mmol/L (ref 96–106)
Creatinine, Ser: 0.77 mg/dL (ref 0.57–1.00)
GFR calc Af Amer: 85 mL/min/{1.73_m2} (ref >59)
GFR calc non Af Amer: 74 mL/min/{1.73_m2} (ref >59)
Glucose: 166 mg/dL — ABNORMAL HIGH (ref 65–99)
Phosphorus: 3.6 mg/dL (ref 2.5–4.5)
Potassium: 4.7 mmol/L (ref 3.5–5.2)
Sodium: 139 mmol/L (ref 134–144)

## 2015-10-14 LAB — LIPID PANEL
Chol/HDL Ratio: 2.7 ratio units (ref 0.0–4.4)
Cholesterol, Total: 160 mg/dL (ref 100–199)
HDL: 60 mg/dL (ref >39)
LDL Calculated: 72 mg/dL (ref 0–99)
Triglycerides: 139 mg/dL (ref 0–149)
VLDL Cholesterol Cal: 28 mg/dL (ref 5–40)

## 2015-11-23 ENCOUNTER — Other Ambulatory Visit: Payer: Self-pay

## 2015-11-23 DIAGNOSIS — I10 Essential (primary) hypertension: Secondary | ICD-10-CM

## 2015-11-23 MED ORDER — LOSARTAN POTASSIUM 100 MG PO TABS: 100.0000 mg | ORAL_TABLET | Freq: Every day | ORAL | 2 refills | Status: DC

## 2015-12-22 ENCOUNTER — Ambulatory Visit (INDEPENDENT_AMBULATORY_CARE_PROVIDER_SITE_OTHER): Payer: PPO | Admitting: Family Medicine

## 2015-12-22 ENCOUNTER — Encounter: Payer: Self-pay | Admitting: Family Medicine

## 2015-12-22 VITALS — BP 128/78 | HR 78 | Temp 97.6°F | Ht 64.0 in | Wt 188.0 lb

## 2015-12-22 DIAGNOSIS — J0100 Acute maxillary sinusitis, unspecified: Secondary | ICD-10-CM | POA: Diagnosis not present

## 2015-12-22 DIAGNOSIS — E010 Iodine-deficiency related diffuse (endemic) goiter: Secondary | ICD-10-CM | POA: Diagnosis not present

## 2015-12-22 MED ORDER — AMOXICILLIN 500 MG PO CAPS: 500.0000 mg | ORAL_CAPSULE | Freq: Three times a day (TID) | ORAL | 0 refills | Status: DC

## 2015-12-22 NOTE — Progress Notes (Signed)
Name: Kristina HumphreysBarbara J Wagner   MRN: [id number]    DOB: 10-12-1934   Date:12/22/2015       Progress Note  Subjective  Chief Complaint  Chief Complaint  Patient presents with  . Headache    Pt stated having headache/light headed, sinus pressure,1 week    Sinusitis  This is a new problem. The current episode started in the past 7 days. The problem has been gradually worsening since onset. There has been no fever. The pain is moderate. Pertinent negatives include no chills, congestion, coughing, diaphoresis, ear pain, headaches, hoarse voice, neck pain, shortness of breath, sinus pressure, sneezing, sore throat or swollen glands. Past treatments include nothing. The treatment provided mild relief.    No problem-specific Assessment & Plan notes found for this encounter.   Past Medical History:  Diagnosis Date  . Allergy   . Anxiety   . Atrial fibrillation (HCC)   . CHF (congestive heart failure) (HCC)   . Depression   . GERD (gastroesophageal reflux disease)   . Hyperlipidemia   . Hypertension     Past Surgical History:  Procedure Laterality Date  . COLONOSCOPY  2006   had 2 polyps- benign    Family History  Problem Relation Age of Onset  . Heart disease Mother     Social History   Social History  . Marital status: Married    Spouse name: N/A  . Number of children: N/A  . Years of education: N/A   Occupational History  . Not on file.   Social History Main Topics  . Smoking status: Former Games developermoker  . Smokeless tobacco: Former NeurosurgeonUser  . Alcohol use No  . Drug use: No  . Sexual activity: No   Other Topics Concern  . Not on file   Social History Narrative  . No narrative on file    No Known Allergies   Review of Systems  Constitutional: Negative for chills, diaphoresis, fever, malaise/fatigue and weight loss.  HENT: Negative for congestion, ear discharge, ear pain, hoarse voice, sinus pressure, sneezing and sore throat.   Eyes: Negative for blurred vision.   Respiratory: Negative for cough, sputum production, shortness of breath and wheezing.   Cardiovascular: Negative for chest pain, palpitations and leg swelling.  Gastrointestinal: Negative for abdominal pain, blood in stool, constipation, diarrhea, heartburn, melena and nausea.  Genitourinary: Negative for dysuria, frequency, hematuria and urgency.  Musculoskeletal: Negative for back pain, joint pain, myalgias and neck pain.  Skin: Negative for rash.  Neurological: Negative for dizziness, tingling, sensory change, focal weakness and headaches.  Endo/Heme/Allergies: Negative for environmental allergies and polydipsia. Does not bruise/bleed easily.  Psychiatric/Behavioral: Negative for depression and suicidal ideas. The patient is not nervous/anxious and does not have insomnia.      Objective  Vitals:   12/22/15 1120  BP: 128/78  Pulse: 78  Temp: 97.6 F (36.4 C)  SpO2: 98%  Weight: 188 lb (85.3 kg)  Height: 5\' 4"  (1.626 m)    Physical Exam  Constitutional: She is well-developed, well-nourished, and in no distress. No distress.  HENT:  Head: Normocephalic and atraumatic.  Right Ear: External ear normal.  Left Ear: External ear normal.  Nose: Nose normal.  Mouth/Throat: Oropharynx is clear and moist.  Eyes: Conjunctivae and EOM are normal. Pupils are equal, round, and reactive to light. Right eye exhibits no discharge. Left eye exhibits no discharge.  Neck: Trachea normal and normal range of motion. Neck supple. Normal carotid pulses, no hepatojugular reflux and no JVD  present. Carotid bruit is not present. Thyromegaly present.  Cardiovascular: Normal rate, regular rhythm, normal heart sounds and intact distal pulses.  Exam reveals no gallop and no friction rub.   No murmur heard. Pulmonary/Chest: Effort normal and breath sounds normal. She has no wheezes. She has no rales.  Abdominal: Soft. Bowel sounds are normal. She exhibits no mass. There is no tenderness. There is no  guarding.  Musculoskeletal: Normal range of motion. She exhibits no edema.  Lymphadenopathy:    She has no cervical adenopathy.  Neurological: She is alert. She has normal reflexes.  Skin: Skin is warm and dry. She is not diaphoretic.  Psychiatric: Mood and affect normal.  Nursing note and vitals reviewed.     Assessment & Plan  Problem List Items Addressed This Visit      Endocrine   Thyromegaly   Relevant Orders   Thyroid Panel With TSH    Other Visit Diagnoses    Acute maxillary sinusitis, recurrence not specified    -  Primary   Relevant Medications   amoxicillin (AMOXIL) 500 MG capsule        Dr. Hayden Rasmusseneanna Jones [facility]  12/22/15

## 2015-12-23 LAB — THYROID PANEL WITH TSH
Free Thyroxine Index: 1.3 (ref 1.2–4.9)
T3 Uptake Ratio: 24 % (ref 24–39)
T4, Total: 5.6 ug/dL (ref 4.5–12.0)
TSH: 1.3 u[IU]/mL (ref 0.450–4.500)

## 2016-02-29 ENCOUNTER — Encounter: Payer: Self-pay | Admitting: Family Medicine

## 2016-02-29 ENCOUNTER — Ambulatory Visit (INDEPENDENT_AMBULATORY_CARE_PROVIDER_SITE_OTHER): Payer: PPO | Admitting: Family Medicine

## 2016-02-29 VITALS — BP 162/88 | HR 88 | Ht 64.0 in | Wt 188.0 lb

## 2016-02-29 DIAGNOSIS — J0100 Acute maxillary sinusitis, unspecified: Secondary | ICD-10-CM

## 2016-02-29 DIAGNOSIS — I10 Essential (primary) hypertension: Secondary | ICD-10-CM | POA: Diagnosis not present

## 2016-02-29 DIAGNOSIS — M1712 Unilateral primary osteoarthritis, left knee: Secondary | ICD-10-CM | POA: Diagnosis not present

## 2016-02-29 MED ORDER — LOSARTAN POTASSIUM 100 MG PO TABS: 100.0000 mg | ORAL_TABLET | Freq: Every day | ORAL | 2 refills | Status: DC

## 2016-02-29 MED ORDER — FUROSEMIDE 20 MG PO TABS: 20.0000 mg | ORAL_TABLET | Freq: Every day | ORAL | 1 refills | Status: DC

## 2016-02-29 MED ORDER — METOPROLOL TARTRATE 100 MG PO TABS: 100.0000 mg | ORAL_TABLET | Freq: Two times a day (BID) | ORAL | 1 refills | Status: DC

## 2016-02-29 MED ORDER — AMOXICILLIN 500 MG PO CAPS: 500.0000 mg | ORAL_CAPSULE | Freq: Three times a day (TID) | ORAL | 0 refills | Status: DC

## 2016-02-29 MED ORDER — AMLODIPINE BESYLATE 2.5 MG PO TABS: 2.5000 mg | ORAL_TABLET | Freq: Every day | ORAL | 3 refills | Status: DC

## 2016-02-29 NOTE — Progress Notes (Signed)
Name: Kristina Wagner   MRN: [id number]    DOB: [birth year]   Date:02/29/2016       Progress Note  Subjective  Chief Complaint  Chief Complaint  Patient presents with  . Hypertension    BP this morning was 168/102    Hypertension  This is a chronic problem. The current episode started more than 1 year ago. The problem has been gradually improving since onset. The problem is controlled. Pertinent negatives include no anxiety, blurred vision, chest pain, headaches, malaise/fatigue, neck pain, orthopnea, palpitations, peripheral edema, PND, shortness of breath or sweats. There are no associated agents to hypertension. There are no known risk factors for coronary artery disease. Past treatments include angiotensin blockers, beta blockers and diuretics. The current treatment provides moderate improvement. There are no compliance problems.  There is no history of angina, kidney disease, CAD/MI, CVA, heart failure, left ventricular hypertrophy, PVD, renovascular disease or retinopathy. There is no history of chronic renal disease or a hypertension causing med.  Knee Pain   The incident occurred more than 1 week ago. There was no injury mechanism. The pain is present in the left knee and right ankle. The quality of the pain is described as aching. The pain has been fluctuating since onset. Pertinent negatives include no inability to bear weight, loss of motion, loss of sensation, muscle weakness, numbness or tingling. She has tried acetaminophen for the symptoms. The treatment provided moderate relief.  Sinusitis  This is a new problem. The current episode started in the past 7 days. The problem has been gradually improving since onset. There has been no fever. The pain is mild. Associated symptoms include congestion and sinus pressure. Pertinent negatives include no chills, coughing, ear pain, headaches, neck pain, shortness of breath or sore throat. Past treatments include acetaminophen. The treatment  provided moderate relief.    No problem-specific Assessment & Plan notes found for this encounter.   Past Medical History:  Diagnosis Date  . Allergy   . Anxiety   . Atrial fibrillation (HCC)   . CHF (congestive heart failure) (HCC)   . Depression   . GERD (gastroesophageal reflux disease)   . Hyperlipidemia   . Hypertension     Past Surgical History:  Procedure Laterality Date  . COLONOSCOPY  2006   had 2 polyps- benign    Family History  Problem Relation Age of Onset  . Heart disease Mother     Social History   Social History  . Marital status: Married    Spouse name: N/A  . Number of children: N/A  . Years of education: N/A   Occupational History  . Not on file.   Social History Main Topics  . Smoking status: Former Games developer  . Smokeless tobacco: Former Neurosurgeon  . Alcohol use No  . Drug use: No  . Sexual activity: No   Other Topics Concern  . Not on file   Social History Narrative  . No narrative on file    No Known Allergies   Review of Systems  Constitutional: Negative for chills, fever, malaise/fatigue and weight loss.  HENT: Positive for congestion and sinus pressure. Negative for ear discharge, ear pain and sore throat.   Eyes: Negative for blurred vision.  Respiratory: Negative for cough, sputum production, shortness of breath and wheezing.   Cardiovascular: Negative for chest pain, palpitations, orthopnea, leg swelling and PND.  Gastrointestinal: Negative for abdominal pain, blood in stool, constipation, diarrhea, heartburn, melena and nausea.  Genitourinary: Negative for  dysuria, frequency, hematuria and urgency.  Musculoskeletal: Negative for back pain, joint pain, myalgias and neck pain.  Skin: Negative for rash.  Neurological: Negative for dizziness, tingling, sensory change, focal weakness, numbness and headaches.  Endo/Heme/Allergies: Negative for environmental allergies and polydipsia. Does not bruise/bleed easily.   Psychiatric/Behavioral: Negative for depression and suicidal ideas. The patient is not nervous/anxious and does not have insomnia.      Objective  Vitals:   02/29/16 1426  BP: (!) 162/88  Pulse: 88  Weight: 188 lb (85.3 kg)  Height: 5\' 4"  (1.626 m)    Physical Exam  Constitutional: She is well-developed, well-nourished, and in no distress. No distress.  HENT:  Head: Normocephalic and atraumatic.  Right Ear: External ear normal.  Left Ear: External ear normal.  Nose: Right sinus exhibits maxillary sinus tenderness. Left sinus exhibits maxillary sinus tenderness.  Mouth/Throat: Oropharynx is clear and moist.  Eyes: Conjunctivae and EOM are normal. Pupils are equal, round, and reactive to light. Right eye exhibits no discharge. Left eye exhibits no discharge.  Neck: Normal range of motion. Neck supple. No JVD present. No thyromegaly present.  Cardiovascular: Normal rate, regular rhythm, normal heart sounds and intact distal pulses.  Exam reveals no gallop and no friction rub.   No murmur heard. Pulmonary/Chest: Effort normal and breath sounds normal. She has no wheezes. She has no rales.  Abdominal: Soft. Bowel sounds are normal. She exhibits no mass. There is no tenderness. There is no guarding.  Musculoskeletal: Normal range of motion. She exhibits no edema.  Lymphadenopathy:    She has no cervical adenopathy.  Neurological: She is alert. She has normal reflexes.  Skin: Skin is warm and dry. She is not diaphoretic.  Psychiatric: Mood and affect normal.  Nursing note and vitals reviewed.     Assessment & Plan  Problem List Items Addressed This Visit    None    Visit Diagnoses    Essential hypertension    -  Primary   Relevant Medications   metoprolol (LOPRESSOR) 100 MG tablet   furosemide (LASIX) 20 MG tablet   losartan (COZAAR) 100 MG tablet   amLODipine (NORVASC) 2.5 MG tablet   Acute maxillary sinusitis, recurrence not specified       Relevant Medications    amoxicillin (AMOXIL) 500 MG capsule   Osteoarthritis of left knee, unspecified osteoarthritis type       Relevant Orders   Ambulatory referral to Orthopedic Surgery        Dr. Elizabeth Sauereanna Thailyn [facility]  02/29/16

## 2016-03-22 ENCOUNTER — Other Ambulatory Visit: Payer: Self-pay

## 2016-03-22 DIAGNOSIS — J0100 Acute maxillary sinusitis, unspecified: Secondary | ICD-10-CM

## 2016-03-22 MED ORDER — AMOXICILLIN 500 MG PO CAPS: 500.0000 mg | ORAL_CAPSULE | Freq: Three times a day (TID) | ORAL | 0 refills | Status: DC

## 2016-03-23 ENCOUNTER — Ambulatory Visit (INDEPENDENT_AMBULATORY_CARE_PROVIDER_SITE_OTHER): Payer: PPO | Admitting: Family Medicine

## 2016-03-23 ENCOUNTER — Encounter: Payer: Self-pay | Admitting: Family Medicine

## 2016-03-23 DIAGNOSIS — J0100 Acute maxillary sinusitis, unspecified: Secondary | ICD-10-CM

## 2016-03-23 DIAGNOSIS — I10 Essential (primary) hypertension: Secondary | ICD-10-CM

## 2016-03-23 MED ORDER — LOSARTAN POTASSIUM 100 MG PO TABS: 100.0000 mg | ORAL_TABLET | Freq: Every day | ORAL | 2 refills | Status: DC

## 2016-03-23 MED ORDER — FUROSEMIDE 20 MG PO TABS: 20.0000 mg | ORAL_TABLET | Freq: Every day | ORAL | 1 refills | Status: DC

## 2016-03-23 MED ORDER — METOPROLOL TARTRATE 100 MG PO TABS: 100.0000 mg | ORAL_TABLET | Freq: Two times a day (BID) | ORAL | 1 refills | Status: DC

## 2016-03-23 MED ORDER — AMOXICILLIN 500 MG PO CAPS: 500.0000 mg | ORAL_CAPSULE | Freq: Three times a day (TID) | ORAL | 0 refills | Status: DC

## 2016-03-23 MED ORDER — AMLODIPINE BESYLATE 5 MG PO TABS: 5.0000 mg | ORAL_TABLET | Freq: Every day | ORAL | 1 refills | Status: DC

## 2016-03-23 NOTE — Progress Notes (Signed)
Name: Kristina Wagner   MRN: [id number]    DOB: May 03, 1934   Date:03/23/2016       Progress Note  Subjective  Chief Complaint  Chief Complaint  Patient presents with  . Hypertension  . Sinusitis    started Amoxil yesterday for cough and cong, drainage/ green production    Hypertension  This is a chronic problem. The current episode started more than 1 year ago. The problem has been gradually improving since onset. The problem is controlled. Pertinent negatives include no anxiety, blurred vision, chest pain, headaches, malaise/fatigue, neck pain, orthopnea, palpitations, peripheral edema, PND, shortness of breath or sweats. Risk factors for coronary artery disease include dyslipidemia and stress. Past treatments include calcium channel blockers, diuretics, beta blockers and angiotensin blockers. The current treatment provides moderate improvement. There are no compliance problems.  There is no history of angina, kidney disease, CAD/MI, CVA, heart failure, left ventricular hypertrophy, PVD, renovascular disease or retinopathy. There is no history of chronic renal disease or a hypertension causing med.  Sinusitis  This is a new problem. The current episode started yesterday. The problem has been gradually improving since onset. There has been no fever. The fever has been present for 3 to 4 days. Pertinent negatives include no chills, coughing, ear pain, headaches, neck pain, shortness of breath or sore throat.    No problem-specific Assessment & Plan notes found for this encounter.   Past Medical History:  Diagnosis Date  . Allergy   . Anxiety   . Atrial fibrillation (HCC)   . CHF (congestive heart failure) (HCC)   . Depression   . GERD (gastroesophageal reflux disease)   . Hyperlipidemia   . Hypertension     Past Surgical History:  Procedure Laterality Date  . COLONOSCOPY  2006   had 2 polyps- benign    Family History  Problem Relation Age of Onset  . Heart disease Mother      Social History   Social History  . Marital status: Married    Spouse name: N/A  . Number of children: N/A  . Years of education: N/A   Occupational History  . Not on file.   Social History Main Topics  . Smoking status: Former Games developer  . Smokeless tobacco: Former Neurosurgeon  . Alcohol use No  . Drug use: No  . Sexual activity: No   Other Topics Concern  . Not on file   Social History Narrative  . No narrative on file    No Known Allergies  Outpatient Medications Prior to Visit  Medication Sig Dispense Refill  . ALPRAZolam (XANAX) 0.25 MG tablet Take 0.25 mg by mouth at bedtime. [facility]    . levocetirizine (XYZAL) 5 MG tablet Take 1 tablet (5 mg total) by mouth every evening. 30 tablet 6  . Multiple Vitamins-Minerals (MULTIVITAL PO) Take 1 tablet by mouth daily.    . mupirocin ointment (BACTROBAN) 2 % Apply to affected area(s) as needed    . Omega-3 Fatty Acids (FISH OIL) 1200 MG CAPS Take 1 capsule (1,200 mg total) by mouth daily. 30 capsule 11  . rivaroxaban (XARELTO) 20 MG TABS tablet Take 1 tablet by mouth daily. Dr Allena Katz    . simvastatin (ZOCOR) 40 MG tablet Take 1 tablet (40 mg total) by mouth daily. 90 tablet 1  . venlafaxine XR (EFFEXOR-XR) 75 MG 24 hr capsule Take 1 capsule (75 mg total) by mouth daily with breakfast. 90 capsule 1  . amLODipine (NORVASC) 2.5 MG tablet Take 1  tablet (2.5 mg total) by mouth daily. (Patient taking differently: Take 5 mg by mouth daily. ) 90 tablet 3  . amoxicillin (AMOXIL) 500 MG capsule Take 1 capsule (500 mg total) by mouth 3 (three) times daily. 30 capsule 0  . furosemide (LASIX) 20 MG tablet Take 1 tablet (20 mg total) by mouth daily. 90 tablet 1  . losartan (COZAAR) 100 MG tablet Take 1 tablet (100 mg total) by mouth daily. 90 tablet 2  . metoprolol (LOPRESSOR) 100 MG tablet Take 1 tablet (100 mg total) by mouth 2 (two) times daily. 180 tablet 1   No facility-administered medications prior to visit.     Review of Systems   Constitutional: Negative for chills, fever, malaise/fatigue and weight loss.  HENT: Negative for ear discharge, ear pain and sore throat.   Eyes: Negative for blurred vision.  Respiratory: Negative for cough, sputum production, shortness of breath and wheezing.   Cardiovascular: Negative for chest pain, palpitations, orthopnea, leg swelling and PND.  Gastrointestinal: Negative for abdominal pain, blood in stool, constipation, diarrhea, heartburn, melena and nausea.  Genitourinary: Negative for dysuria, frequency, hematuria and urgency.  Musculoskeletal: Negative for back pain, joint pain, myalgias and neck pain.  Skin: Negative for rash.  Neurological: Negative for dizziness, tingling, sensory change, focal weakness and headaches.  Endo/Heme/Allergies: Negative for environmental allergies and polydipsia. Does not bruise/bleed easily.  Psychiatric/Behavioral: Negative for depression and suicidal ideas. The patient is not nervous/anxious and does not have insomnia.      Objective  Vitals:   03/23/16 1353  BP: (!) 154/80  Pulse: 64  Temp: 98.5 F (36.9 C)  Weight: 190 lb (86.2 kg)  Height: 5\' 4"  (1.626 m)    Physical Exam  Constitutional: She is well-developed, well-nourished, and in no distress. No distress.  HENT:  Head: Normocephalic and atraumatic.  Right Ear: Tympanic membrane and external ear normal.  Left Ear: Tympanic membrane and external ear normal.  Nose: Nose normal.  Mouth/Throat: Oropharynx is clear and moist.  Eyes: Conjunctivae and EOM are normal. Pupils are equal, round, and reactive to light. Right eye exhibits no discharge. Left eye exhibits no discharge.  Neck: Normal range of motion. Neck supple. No JVD present. No thyromegaly present.  Cardiovascular: Normal rate, regular rhythm, normal heart sounds and intact distal pulses.  Exam reveals no gallop and no friction rub.   No murmur heard. Pulmonary/Chest: Effort normal and breath sounds normal. She has no  wheezes. She has no rales.  Abdominal: Soft. Bowel sounds are normal. She exhibits no mass. There is no tenderness. There is no guarding.  Musculoskeletal: Normal range of motion. She exhibits no edema.  Lymphadenopathy:    She has no cervical adenopathy.  Neurological: She is alert. She has normal sensation, normal strength and normal reflexes.  Skin: Skin is warm and dry. She is not diaphoretic.  Psychiatric: Mood and affect normal.      Assessment & Plan  Problem List Items Addressed This Visit    None    Visit Diagnoses    Acute maxillary sinusitis, recurrence not specified       Relevant Medications   amoxicillin (AMOXIL) 500 MG capsule   Essential hypertension       Relevant Medications   metoprolol (LOPRESSOR) 100 MG tablet   losartan (COZAAR) 100 MG tablet   furosemide (LASIX) 20 MG tablet   amLODipine (NORVASC) 5 MG tablet      Meds ordered this encounter  Medications  . amoxicillin (AMOXIL) 500  MG capsule    Sig: Take 1 capsule (500 mg total) by mouth 3 (three) times daily.    Dispense:  30 capsule    Refill:  0  . metoprolol (LOPRESSOR) 100 MG tablet    Sig: Take 1 tablet (100 mg total) by mouth 2 (two) times daily.    Dispense:  180 tablet    Refill:  1  . losartan (COZAAR) 100 MG tablet    Sig: Take 1 tablet (100 mg total) by mouth daily.    Dispense:  90 tablet    Refill:  2  . furosemide (LASIX) 20 MG tablet    Sig: Take 1 tablet (20 mg total) by mouth daily.    Dispense:  90 tablet    Refill:  1  . amLODipine (NORVASC) 5 MG tablet    Sig: Take 1 tablet (5 mg total) by mouth daily.    Dispense:  90 tablet    Refill:  1      Dr. Elizabeth Sauer [facility]  03/23/16

## 2016-03-28 ENCOUNTER — Ambulatory Visit: Payer: PPO | Admitting: Family Medicine

## 2016-04-11 DIAGNOSIS — I519 Heart disease, unspecified: Secondary | ICD-10-CM | POA: Diagnosis not present

## 2016-04-11 DIAGNOSIS — E785 Hyperlipidemia, unspecified: Secondary | ICD-10-CM | POA: Diagnosis not present

## 2016-04-11 DIAGNOSIS — I10 Essential (primary) hypertension: Secondary | ICD-10-CM | POA: Diagnosis not present

## 2016-04-11 DIAGNOSIS — I480 Paroxysmal atrial fibrillation: Secondary | ICD-10-CM | POA: Diagnosis not present

## 2016-08-14 ENCOUNTER — Ambulatory Visit (INDEPENDENT_AMBULATORY_CARE_PROVIDER_SITE_OTHER): Payer: PPO

## 2016-08-14 VITALS — BP 124/62 | HR 62 | Temp 98.3°F | Resp 16 | Ht 64.0 in | Wt 189.0 lb

## 2016-08-14 DIAGNOSIS — Z0000 Encounter for general adult medical examination without abnormal findings: Secondary | ICD-10-CM | POA: Diagnosis not present

## 2016-08-14 NOTE — Progress Notes (Signed)
Subjective:   Dan HumphreysBarbara J Subramanian is a 82 y.o. female who presents for Medicare Annual (Subsequent) preventive examination.  Review of Systems:   Cardiac Risk Factors include: advanced age (>7555men, 65>65 women);hypertension;dyslipidemia;obesity (BMI >30kg/m2)     Objective:     Vitals: BP 124/62 (BP Location: Left Arm, Patient Position: Sitting)   Pulse 62   Temp 98.3 F (36.8 C)   Resp 16   Ht 5\' 4"  (1.626 m)   Wt 189 lb (85.7 kg)   BMI 32.44 kg/m   Body mass index is 32.44 kg/m.   Tobacco History  Smoking Status  . Former Smoker  . Quit date: 01/17/1983  Smokeless Tobacco  . Former NeurosurgeonUser     Counseling given: Not Answered   Past Medical History:  Diagnosis Date  . Allergy   . Anxiety   . Atrial fibrillation (HCC)   . CHF (congestive heart failure) (HCC)   . Depression   . GERD (gastroesophageal reflux disease)   . Hyperlipidemia   . Hypertension    Past Surgical History:  Procedure Laterality Date  . COLONOSCOPY  2006   had 2 polyps- benign   Family History  Problem Relation Age of Onset  . Colon cancer Father   . Heart disease Brother    History  Sexual Activity  . Sexual activity: No    Outpatient Encounter Prescriptions as of 08/14/2016  Medication Sig  . ALPRAZolam (XANAX) 0.25 MG tablet Take 0.25 mg by mouth at bedtime. [facility]  . amLODipine (NORVASC) 5 MG tablet Take 1 tablet (5 mg total) by mouth daily.  . furosemide (LASIX) 20 MG tablet Take 1 tablet (20 mg total) by mouth daily.  Marland Kitchen. levocetirizine (XYZAL) 5 MG tablet Take 1 tablet (5 mg total) by mouth every evening.  Marland Kitchen. losartan (COZAAR) 100 MG tablet Take 1 tablet (100 mg total) by mouth daily.  . metoprolol (LOPRESSOR) 100 MG tablet Take 1 tablet (100 mg total) by mouth 2 (two) times daily.  . Multiple Vitamins-Minerals (MULTIVITAL PO) Take 1 tablet by mouth daily.  . rivaroxaban (XARELTO) 20 MG TABS tablet Take 1 tablet by mouth daily. Dr Allena KatzPatel  . simvastatin (ZOCOR) 40 MG tablet Take 1  tablet (40 mg total) by mouth daily.  Marland Kitchen. venlafaxine XR (EFFEXOR-XR) 75 MG 24 hr capsule Take 1 capsule (75 mg total) by mouth daily with breakfast.  . Omega-3 Fatty Acids (FISH OIL) 1200 MG CAPS Take 1 capsule (1,200 mg total) by mouth daily. (Patient not taking: Reported on 08/14/2016)  . [DISCONTINUED] amoxicillin (AMOXIL) 500 MG capsule Take 1 capsule (500 mg total) by mouth 3 (three) times daily. (Patient not taking: Reported on 08/14/2016)  . [DISCONTINUED] mupirocin ointment (BACTROBAN) 2 % Apply to affected area(s) as needed   No facility-administered encounter medications on file as of 08/14/2016.     Activities of Daily Living In your present state of health, do you have any difficulty performing the following activities: 08/14/2016  Hearing? Y  Comment has hearing aids  Vision? Y  Comment has cataracts   Difficulty concentrating or making decisions? N  Walking or climbing stairs? Y  Dressing or bathing? N  Doing errands, shopping? N  Preparing Food and eating ? N  Using the Toilet? N  In the past six months, have you accidently leaked urine? Y  Do you have problems with loss of bowel control? N  Managing your Medications? N  Managing your Finances? N  Housekeeping or managing your Housekeeping? N  Some recent data might be hidden    Patient Care Team: Duanne LimerickJones, Deanna C, MD as PCP - General (Family Medicine)    Assessment:     Exercise Activities and Dietary recommendations Current Exercise Habits: The patient does not participate in regular exercise at present, Exercise limited by: None identified  Goals    . Increase water intake          Recommend drinking at least 5-6 glasses of water a day       Fall Risk Fall Risk  08/14/2016 03/23/2016 02/15/2015 10/22/2014  Falls in the past year? No No No No   Depression Screen PHQ 2/9 Scores 08/14/2016 03/23/2016 02/15/2015 10/22/2014  PHQ - 2 Score 3 0 0 0  PHQ- 9 Score 7 - - -     Cognitive Function     6CIT Screen  08/14/2016  What Year? 0 points  What month? 0 points  What time? 0 points  Count back from 20 0 points  Months in reverse 0 points  Repeat phrase 2 points  Total Score 2    Immunization History  Administered Date(s) Administered  . Influenza,inj,Quad PF,36+ Mos 10/22/2014  . Influenza-Unspecified 10/30/2013  . Pneumococcal Conjugate-13 10/30/2013, 10/22/2014  . Pneumococcal Polysaccharide-23 09/19/2012   Screening Tests Health Maintenance  Topic Date Due  . TETANUS/TDAP  08/07/2017 (Originally 06/29/1953)  . DEXA SCAN  08/14/2017 (Originally 06/28/2001)  . INFLUENZA VACCINE  08/16/2016  . PNA vac Low Risk Adult  Completed      Plan:     I have personally reviewed and addressed the Medicare Annual Wellness questionnaire and have noted the following in the patient's chart:  A. Medical and social history B. Use of alcohol, tobacco or illicit drugs  C. Current medications and supplements D. Functional ability and status E.  Nutritional status F.  Physical activity G. Advance directives H. List of other physicians I.  Hospitalizations, surgeries, and ER visits in previous 12 months J.  Vitals K. Screenings such as hearing and vision if needed, cognitive and depression L. Referrals and appointments  In addition, I have reviewed and discussed with patient certain preventive protocols, quality metrics, and best practice recommendations. A written personalized care plan for preventive services as well as general preventive health recommendations were provided to patient.   Signed,  Marin Robertsiffany Hill, LPN Nurse Health Advisor   MD Recommendations: none

## 2016-08-14 NOTE — Patient Instructions (Addendum)
Kristina Wagner , Thank you for taking time to come for your Medicare Wellness Visit. I appreciate your ongoing commitment to your health goals. Please review the following plan we discussed and let me know if I can assist you in the future.   Screening recommendations/referrals: Colonoscopy: no longer required Mammogram: no longer required Bone Density: due, declined Recommended yearly ophthalmology/optometry visit for glaucoma screening and checkup Recommended yearly dental visit for hygiene and checkup  Vaccinations: Influenza vaccine: up to date, due 09/2016 Pneumococcal vaccine: up to date Tdap vaccine: due,check with your insurance company for coverage Shingles vaccine: due,check with your insurance company for coverage  Advanced directives: Please bring a copy of your health care power of attorney and living will to the office at your convenience.  Conditions/risks identified: Recommend drinking at least 5-6 glasses of water a day   Next appointment: Follow up in one year for your annual wellness exam.    Preventive Care 65 Years and Older, Female Preventive care refers to lifestyle choices and visits with your health care provider that can promote health and wellness. What does preventive care include?  A yearly physical exam. This is also called an annual well check.  Dental exams once or twice a year.  Routine eye exams. Ask your health care provider how often you should have your eyes checked.  Personal lifestyle choices, including:  Daily care of your teeth and gums.  Regular physical activity.  Eating a healthy diet.  Avoiding tobacco and drug use.  Limiting alcohol use.  Practicing safe sex.  Taking low-dose aspirin every day.  Taking vitamin and mineral supplements as recommended by your health care provider. What happens during an annual well check? The services and screenings done by your health care provider during your annual well check will depend on  your age, overall health, lifestyle risk factors, and family history of disease. Counseling  Your health care provider may ask you questions about your:  Alcohol use.  Tobacco use.  Drug use.  Emotional well-being.  Home and relationship well-being.  Sexual activity.  Eating habits.  History of falls.  Memory and ability to understand (cognition).  Work and work Astronomerenvironment.  Reproductive health. Screening  You may have the following tests or measurements:  Height, weight, and BMI.  Blood pressure.  Lipid and cholesterol levels. These may be checked every 5 years, or more frequently if you are over 80 years old.  Skin check.  Lung cancer screening. You may have this screening every year starting at age 80 if you have a 30-pack-year history of smoking and currently smoke or have quit within the past 15 years.  Fecal occult blood test (FOBT) of the stool. You may have this test every year starting at age 80.  Flexible sigmoidoscopy or colonoscopy. You may have a sigmoidoscopy every 5 years or a colonoscopy every 10 years starting at age 80.  Hepatitis C blood test.  Hepatitis B blood test.  Sexually transmitted disease (STD) testing.  Diabetes screening. This is done by checking your blood sugar (glucose) after you have not eaten for a while (fasting). You may have this done every 1-3 years.  Bone density scan. This is done to screen for osteoporosis. You may have this done starting at age 80.  Mammogram. This may be done every 1-2 years. Talk to your health care provider about how often you should have regular mammograms. Talk with your health care provider about your test results, treatment options, and if necessary,  the need for more tests. Vaccines  Your health care provider may recommend certain vaccines, such as:  Influenza vaccine. This is recommended every year.  Tetanus, diphtheria, and acellular pertussis (Tdap, Td) vaccine. You may need a Td booster  every 10 years.  Zoster vaccine. You may need this after age 46.  Pneumococcal 13-valent conjugate (PCV13) vaccine. One dose is recommended after age 33.  Pneumococcal polysaccharide (PPSV23) vaccine. One dose is recommended after age 23. Talk to your health care provider about which screenings and vaccines you need and how often you need them. This information is not intended to replace advice given to you by your health care provider. Make sure you discuss any questions you have with your health care provider. Document Released: 01/29/2015 Document Revised: 09/22/2015 Document Reviewed: 11/03/2014 Elsevier Interactive Patient Education  2017 [place] Prevention in the Home Falls can cause injuries. They can happen to people of all ages. There are many things you can do to make your home safe and to help prevent falls. What can I do on the outside of my home?  Regularly fix the edges of walkways and driveways and fix any cracks.  Remove anything that might make you trip as you walk through a door, such as a raised step or threshold.  Trim any bushes or trees on the path to your home.  Use bright outdoor lighting.  Clear any walking paths of anything that might make someone trip, such as rocks or tools.  Regularly check to see if handrails are loose or broken. Make sure that both sides of any steps have handrails.  Any raised decks and porches should have guardrails on the edges.  Have any leaves, snow, or ice cleared regularly.  Use sand or salt on walking paths during winter.  Clean up any spills in your garage right away. This includes oil or grease spills. What can I do in the bathroom?  Use night lights.  Install grab bars by the toilet and in the tub and shower. Do not use towel bars as grab bars.  Use non-skid mats or decals in the tub or shower.  If you need to sit down in the shower, use a plastic, non-slip stool.  Keep the floor dry. Clean up any  water that spills on the floor as soon as it happens.  Remove soap buildup in the tub or shower regularly.  Attach bath mats securely with double-sided non-slip rug tape.  Do not have throw rugs and other things on the floor that can make you trip. What can I do in the bedroom?  Use night lights.  Make sure that you have a light by your bed that is easy to reach.  Do not use any sheets or blankets that are too big for your bed. They should not hang down onto the floor.  Have a firm chair that has side arms. You can use this for support while you get dressed.  Do not have throw rugs and other things on the floor that can make you trip. What can I do in the kitchen?  Clean up any spills right away.  Avoid walking on wet floors.  Keep items that you use a lot in easy-to-reach places.  If you need to reach something above you, use a strong step stool that has a grab bar.  Keep electrical cords out of the way.  Do not use floor polish or wax that makes floors slippery. If you must use  wax, use non-skid floor wax.  Do not have throw rugs and other things on the floor that can make you trip. What can I do with my stairs?  Do not leave any items on the stairs.  Make sure that there are handrails on both sides of the stairs and use them. Fix handrails that are broken or loose. Make sure that handrails are as long as the stairways.  Check any carpeting to make sure that it is firmly attached to the stairs. Fix any carpet that is loose or worn.  Avoid having throw rugs at the top or bottom of the stairs. If you do have throw rugs, attach them to the floor with carpet tape.  Make sure that you have a light switch at the top of the stairs and the bottom of the stairs. If you do not have them, ask someone to add them for you. What else can I do to help prevent falls?  Wear shoes that:  Do not have high heels.  Have rubber bottoms.  Are comfortable and fit you well.  Are closed  at the toe. Do not wear sandals.  If you use a stepladder:  Make sure that it is fully opened. Do not climb a closed stepladder.  Make sure that both sides of the stepladder are locked into place.  Ask someone to hold it for you, if possible.  Clearly mark and make sure that you can see:  Any grab bars or handrails.  First and last steps.  Where the edge of each step is.  Use tools that help you move around (mobility aids) if they are needed. These include:  Canes.  Walkers.  Scooters.  Crutches.  Turn on the lights when you go into a dark area. Replace any light bulbs as soon as they burn out.  Set up your furniture so you have a clear path. Avoid moving your furniture around.  If any of your floors are uneven, fix them.  If there are any pets around you, be aware of where they are.  Review your medicines with your doctor. Some medicines can make you feel dizzy. This can increase your chance of falling. Ask your doctor what other things that you can do to help prevent falls. This information is not intended to replace advice given to you by your health care provider. Make sure you discuss any questions you have with your health care provider. Document Released: 10/29/2008 Document Revised: 06/10/2015 Document Reviewed: 02/06/2014 Elsevier Interactive Patient Education  2017 [employer].

## 2016-10-17 DIAGNOSIS — I480 Paroxysmal atrial fibrillation: Secondary | ICD-10-CM | POA: Diagnosis not present

## 2016-10-17 DIAGNOSIS — I519 Heart disease, unspecified: Secondary | ICD-10-CM | POA: Diagnosis not present

## 2016-10-19 ENCOUNTER — Encounter: Payer: Self-pay | Admitting: Family Medicine

## 2016-10-19 ENCOUNTER — Ambulatory Visit (INDEPENDENT_AMBULATORY_CARE_PROVIDER_SITE_OTHER): Payer: PPO | Admitting: Family Medicine

## 2016-10-19 VITALS — BP 138/80 | HR 64 | Temp 98.3°F | Ht 64.0 in | Wt 191.0 lb

## 2016-10-19 DIAGNOSIS — R69 Illness, unspecified: Secondary | ICD-10-CM | POA: Diagnosis not present

## 2016-10-19 DIAGNOSIS — E782 Mixed hyperlipidemia: Secondary | ICD-10-CM

## 2016-10-19 DIAGNOSIS — J0100 Acute maxillary sinusitis, unspecified: Secondary | ICD-10-CM

## 2016-10-19 DIAGNOSIS — I10 Essential (primary) hypertension: Secondary | ICD-10-CM

## 2016-10-19 DIAGNOSIS — Z23 Encounter for immunization: Secondary | ICD-10-CM | POA: Diagnosis not present

## 2016-10-19 MED ORDER — LOSARTAN POTASSIUM 100 MG PO TABS: 100.0000 mg | ORAL_TABLET | Freq: Every day | ORAL | 2 refills | Status: DC

## 2016-10-19 MED ORDER — SIMVASTATIN 40 MG PO TABS: 40.0000 mg | ORAL_TABLET | Freq: Every day | ORAL | 1 refills | Status: DC

## 2016-10-19 MED ORDER — AMOXICILLIN 500 MG PO CAPS: 500.0000 mg | ORAL_CAPSULE | Freq: Three times a day (TID) | ORAL | 0 refills | Status: DC

## 2016-10-19 MED ORDER — METOPROLOL TARTRATE 100 MG PO TABS: 100.0000 mg | ORAL_TABLET | Freq: Two times a day (BID) | ORAL | 1 refills | Status: DC

## 2016-10-19 MED ORDER — AMLODIPINE BESYLATE 5 MG PO TABS: 5.0000 mg | ORAL_TABLET | Freq: Every day | ORAL | 1 refills | Status: DC

## 2016-10-19 NOTE — Progress Notes (Signed)
Name: Kristina Wagner   MRN: [id number]    DOB: [birth year]   Date:10/19/2016       Progress Note  Subjective  Chief Complaint  Chief Complaint  Patient presents with  . Hypertension  . Hyperlipidemia  . Sinusitis    thick drainage in throat, sinus headaches    Hypertension  This is a chronic problem. The current episode started more than 1 year ago. The problem has been waxing and waning since onset. The problem is controlled. Pertinent negatives include no anxiety, blurred vision, chest pain, headaches, malaise/fatigue, neck pain, orthopnea, palpitations, peripheral edema, PND, shortness of breath or sweats. There are no associated agents to hypertension. There are no known risk factors for coronary artery disease. Past treatments include beta blockers, calcium channel blockers and angiotensin blockers. The current treatment provides moderate improvement. There are no compliance problems.  There is no history of angina, kidney disease, CAD/MI, CVA, heart failure, left ventricular hypertrophy, PVD or retinopathy. There is no history of chronic renal disease, a hypertension causing med or renovascular disease.  Hyperlipidemia  This is a chronic problem. The current episode started more than 1 year ago. The problem is controlled. Recent lipid tests were reviewed and are normal. She has no history of chronic renal disease, hypothyroidism or obesity. There are no known factors aggravating her hyperlipidemia. Pertinent negatives include no chest pain, focal sensory loss, focal weakness, leg pain, myalgias or shortness of breath. Current antihyperlipidemic treatment includes statins. The current treatment provides moderate improvement of lipids. There are no compliance problems.  Risk factors for coronary artery disease include dyslipidemia, diabetes mellitus and hypertension.  Sinusitis  This is a chronic problem. The current episode started in the past 7 days. The problem has been waxing and waning  since onset. There has been no fever. The pain is moderate. Associated symptoms include congestion. Pertinent negatives include no chills, coughing, diaphoresis, ear pain, headaches, hoarse voice, neck pain, shortness of breath, sinus pressure, sneezing, sore throat or swollen glands. Past treatments include nothing.  Neck Pain   This is a recurrent problem. The current episode started in the past 7 days. The problem has been waxing and waning. The pain is associated with nothing. The pain is present in the left side. The pain is moderate. Pertinent negatives include no chest pain, fever, headaches, leg pain, tingling, weakness or weight loss.    No problem-specific Assessment & Plan notes found for this encounter.   Past Medical History:  Diagnosis Date  . Allergy   . Anxiety   . Atrial fibrillation (HCC)   . CHF (congestive heart failure) (HCC)   . Depression   . GERD (gastroesophageal reflux disease)   . Hyperlipidemia   . Hypertension     Past Surgical History:  Procedure Laterality Date  . COLONOSCOPY  2006   had 2 polyps- benign    Family History  Problem Relation Age of Onset  . Colon cancer Father   . Heart disease Brother     Social History   Social History  . Marital status: Married    Spouse name: N/A  . Number of children: N/A  . Years of education: N/A   Occupational History  . Not on file.   Social History Main Topics  . Smoking status: Former Smoker    Quit date: 01/17/1983  . Smokeless tobacco: Former Neurosurgeon  . Alcohol use No  . Drug use: No  . Sexual activity: No   Other Topics Concern  .  Not on file   Social History Narrative  . No narrative on file    No Known Allergies  Outpatient Medications Prior to Visit  Medication Sig Dispense Refill  . ALPRAZolam (XANAX) 0.25 MG tablet Take 0.25 mg by mouth 2 (two) times daily. [facility]    . Multiple Vitamins-Minerals (MULTIVITAL PO) Take 1 tablet by mouth daily.    . Omega-3 Fatty Acids (FISH OIL) 1200  MG CAPS Take 1 capsule (1,200 mg total) by mouth daily. 30 capsule 11  . rivaroxaban (XARELTO) 20 MG TABS tablet Take 1 tablet by mouth daily. Dr Allena Katz    . venlafaxine XR (EFFEXOR-XR) 75 MG 24 hr capsule Take 1 capsule (75 mg total) by mouth daily with breakfast. (Patient taking differently: Take 75 mg by mouth daily with breakfast. [facility]) 90 capsule 1  . amLODipine (NORVASC) 5 MG tablet Take 1 tablet (5 mg total) by mouth daily. 90 tablet 1  . furosemide (LASIX) 20 MG tablet Take 1 tablet (20 mg total) by mouth daily. 90 tablet 1  . losartan (COZAAR) 100 MG tablet Take 1 tablet (100 mg total) by mouth daily. 90 tablet 2  . metoprolol (LOPRESSOR) 100 MG tablet Take 1 tablet (100 mg total) by mouth 2 (two) times daily. 180 tablet 1  . simvastatin (ZOCOR) 40 MG tablet Take 1 tablet (40 mg total) by mouth daily. 90 tablet 1  . levocetirizine (XYZAL) 5 MG tablet Take 1 tablet (5 mg total) by mouth every evening. 30 tablet 6   No facility-administered medications prior to visit.     Review of Systems  Constitutional: Negative for chills, diaphoresis, fever, malaise/fatigue and weight loss.  HENT: Positive for congestion. Negative for ear discharge, ear pain, hoarse voice, sinus pressure, sneezing and sore throat.   Eyes: Negative for blurred vision.  Respiratory: Negative for cough, sputum production, shortness of breath and wheezing.   Cardiovascular: Negative for chest pain, palpitations, orthopnea, leg swelling and PND.  Gastrointestinal: Negative for abdominal pain, blood in stool, constipation, diarrhea, heartburn, melena and nausea.  Genitourinary: Negative for dysuria, frequency, hematuria and urgency.  Musculoskeletal: Negative for back pain, joint pain, myalgias and neck pain.  Skin: Negative for rash.  Neurological: Negative for dizziness, tingling, sensory change, focal weakness, weakness and headaches.  Endo/Heme/Allergies: Negative for environmental allergies and polydipsia. Does not  bruise/bleed easily.  Psychiatric/Behavioral: Negative for depression and suicidal ideas. The patient is not nervous/anxious and does not have insomnia.      Objective  Vitals:   10/19/16 1404  BP: 138/80  Pulse: 64  Temp: 98.3 F (36.8 C)  TempSrc: Oral  Weight: 191 lb (86.6 kg)  Height:  (1.626 m)    Physical Exam  Constitutional: She is well-developed, well-nourished, and in no distress. No distress.  HENT:  Head: Normocephalic and atraumatic.  Right Ear: External ear normal.  Left Ear: External ear normal.  Nose: Right sinus exhibits maxillary sinus tenderness. Left sinus exhibits maxillary sinus tenderness.  Mouth/Throat: Oropharynx is clear and moist.  Eyes: Pupils are equal, round, and reactive to light. Conjunctivae and EOM are normal. Right eye exhibits no discharge. Left eye exhibits no discharge.  Neck: Normal range of motion. Neck supple. No JVD present. No thyromegaly present.  Cardiovascular: Normal rate, regular rhythm, normal heart sounds and intact distal pulses.  Exam reveals no gallop and no friction rub.   No murmur heard. Pulmonary/Chest: Effort normal and breath sounds normal. She has no wheezes. She has no rales.  Abdominal:  Soft. Bowel sounds are normal. She exhibits no mass. There is no tenderness. There is no guarding.  Musculoskeletal: Normal range of motion. She exhibits no edema.  Lymphadenopathy:    She has no cervical adenopathy.  Neurological: She is alert. She has normal reflexes.  Skin: Skin is warm and dry. She is not diaphoretic.  Psychiatric: Mood and affect normal.  Nursing note and vitals reviewed.     Assessment & Plan  Problem List Items Addressed This Visit      Cardiovascular and Mediastinum   Essential hypertension - Primary   Relevant Medications   amLODipine (NORVASC) 5 MG tablet   losartan (COZAAR) 100 MG tablet   metoprolol tartrate (LOPRESSOR) 100 MG tablet   simvastatin (ZOCOR) 40 MG tablet   Other Relevant  Orders   Renal Function Panel     Other   Hyperlipidemia   Relevant Medications   amLODipine (NORVASC) 5 MG tablet   losartan (COZAAR) 100 MG tablet   metoprolol tartrate (LOPRESSOR) 100 MG tablet   simvastatin (ZOCOR) 40 MG tablet   Other Relevant Orders   Lipid Profile    Other Visit Diagnoses    Taking medication for chronic disease       Relevant Orders   Hepatic function panel   Influenza vaccine needed       Relevant Orders   Flu vaccine HIGH DOSE PF (Fluzone High dose) (Completed)   Acute maxillary sinusitis, recurrence not specified       Relevant Medications   amoxicillin (AMOXIL) 500 MG capsule      Meds ordered this encounter  Medications  . amLODipine (NORVASC) 5 MG tablet    Sig: Take 1 tablet (5 mg total) by mouth daily.    Dispense:  90 tablet    Refill:  1  . losartan (COZAAR) 100 MG tablet    Sig: Take 1 tablet (100 mg total) by mouth daily.    Dispense:  90 tablet    Refill:  2  . metoprolol tartrate (LOPRESSOR) 100 MG tablet    Sig: Take 1 tablet (100 mg total) by mouth 2 (two) times daily.    Dispense:  180 tablet    Refill:  1  . simvastatin (ZOCOR) 40 MG tablet    Sig: Take 1 tablet (40 mg total) by mouth daily.    Dispense:  90 tablet    Refill:  1  . amoxicillin (AMOXIL) 500 MG capsule    Sig: Take 1 capsule (500 mg total) by mouth 3 (three) times daily.    Dispense:  30 capsule    Refill:  0      Dr. Elizabeth Sauer [facility]  10/19/16

## 2016-10-20 LAB — RENAL FUNCTION PANEL
Albumin: 4.3 g/dL (ref 3.5–4.7)
BUN/Creatinine Ratio: 15 (ref 12–28)
BUN: 13 mg/dL (ref 8–27)
CO2: 25 mmol/L (ref 20–29)
Calcium: 9.3 mg/dL (ref 8.7–10.3)
Chloride: 100 mmol/L (ref 96–106)
Creatinine, Ser: 0.87 mg/dL (ref 0.57–1.00)
GFR calc Af Amer: 73 mL/min/{1.73_m2} (ref >59)
GFR calc non Af Amer: 63 mL/min/{1.73_m2} (ref >59)
Glucose: 188 mg/dL — ABNORMAL HIGH (ref 65–99)
Phosphorus: 3.2 mg/dL (ref 2.5–4.5)
Potassium: 4.3 mmol/L (ref 3.5–5.2)
Sodium: 140 mmol/L (ref 134–144)

## 2016-10-20 LAB — HEPATIC FUNCTION PANEL
ALT: 24 IU/L (ref 0–32)
AST: 28 IU/L (ref 0–40)
Alkaline Phosphatase: 76 IU/L (ref 39–117)
Bilirubin Total: 0.3 mg/dL (ref 0.0–1.2)
Bilirubin, Direct: 0.11 mg/dL (ref 0.00–0.40)
Total Protein: 7.4 g/dL (ref 6.0–8.5)

## 2016-10-20 LAB — LIPID PANEL
Chol/HDL Ratio: 2.8 ratio (ref 0.0–4.4)
Cholesterol, Total: 156 mg/dL (ref 100–199)
HDL: 55 mg/dL (ref >39)
LDL Calculated: 69 mg/dL (ref 0–99)
Triglycerides: 161 mg/dL — ABNORMAL HIGH (ref 0–149)
VLDL Cholesterol Cal: 32 mg/dL (ref 5–40)

## 2016-10-27 DIAGNOSIS — E669 Obesity, unspecified: Secondary | ICD-10-CM | POA: Insufficient documentation

## 2016-10-27 DIAGNOSIS — M25561 Pain in right knee: Secondary | ICD-10-CM | POA: Diagnosis not present

## 2016-10-27 DIAGNOSIS — M170 Bilateral primary osteoarthritis of knee: Secondary | ICD-10-CM | POA: Diagnosis not present

## 2016-10-27 DIAGNOSIS — M1711 Unilateral primary osteoarthritis, right knee: Secondary | ICD-10-CM | POA: Insufficient documentation

## 2016-10-27 DIAGNOSIS — M25562 Pain in left knee: Secondary | ICD-10-CM | POA: Diagnosis not present

## 2016-10-27 DIAGNOSIS — G8929 Other chronic pain: Secondary | ICD-10-CM | POA: Diagnosis not present

## 2017-01-26 ENCOUNTER — Ambulatory Visit: Payer: Medicare Other | Admitting: Family Medicine

## 2017-01-26 ENCOUNTER — Encounter: Payer: Self-pay | Admitting: Family Medicine

## 2017-01-26 VITALS — BP 130/80 | HR 78 | Ht 64.0 in | Wt 190.0 lb

## 2017-01-26 DIAGNOSIS — K219 Gastro-esophageal reflux disease without esophagitis: Secondary | ICD-10-CM | POA: Diagnosis not present

## 2017-01-26 DIAGNOSIS — E782 Mixed hyperlipidemia: Secondary | ICD-10-CM

## 2017-01-26 DIAGNOSIS — J069 Acute upper respiratory infection, unspecified: Secondary | ICD-10-CM

## 2017-01-26 DIAGNOSIS — J309 Allergic rhinitis, unspecified: Secondary | ICD-10-CM | POA: Diagnosis not present

## 2017-01-26 DIAGNOSIS — I10 Essential (primary) hypertension: Secondary | ICD-10-CM | POA: Diagnosis not present

## 2017-01-26 MED ORDER — AMLODIPINE BESYLATE 5 MG PO TABS: 5.0000 mg | ORAL_TABLET | Freq: Every day | ORAL | 1 refills | Status: DC

## 2017-01-26 MED ORDER — FISH OIL 1200 MG PO CAPS: 1.0000 | ORAL_CAPSULE | Freq: Every day | ORAL | 11 refills | Status: DC

## 2017-01-26 MED ORDER — METOPROLOL TARTRATE 100 MG PO TABS: 100.0000 mg | ORAL_TABLET | Freq: Two times a day (BID) | ORAL | 1 refills | Status: DC

## 2017-01-26 MED ORDER — LOSARTAN POTASSIUM 100 MG PO TABS: 100.0000 mg | ORAL_TABLET | Freq: Every day | ORAL | 2 refills | Status: DC

## 2017-01-26 MED ORDER — SIMVASTATIN 40 MG PO TABS: 40.0000 mg | ORAL_TABLET | Freq: Every day | ORAL | 1 refills | Status: DC

## 2017-01-26 NOTE — Progress Notes (Signed)
Name: GRICELDA FOLAND   MRN: [id number]    DOB: 1934-03-05   Date:01/26/2017       Progress Note  Subjective  Chief Complaint  Chief Complaint  Patient presents with  . Hypertension    b/p recheck after an elevated reading at [facility]    Hypertension  This is a new problem. The current episode started in the past 7 days. The problem has been gradually improving since onset. The problem is controlled. Pertinent negatives include no anxiety, blurred vision, chest pain, headaches, malaise/fatigue, neck pain, orthopnea, palpitations, peripheral edema, PND, shortness of breath or sweats. There are no associated agents to hypertension. There are no known risk factors for coronary artery disease. Past treatments include calcium channel blockers, beta blockers and angiotensin blockers. The current treatment provides moderate improvement. There are no compliance problems.  There is no history of angina, kidney disease, CAD/MI, CVA, heart failure, left ventricular hypertrophy, PVD or retinopathy. There is no history of chronic renal disease, a hypertension causing med or renovascular disease.  Sinusitis  This is a new problem. The current episode started 1 to 4 weeks ago. The problem has been waxing and waning since onset. There has been no fever. Associated symptoms include congestion, diaphoresis and sinus pressure. Pertinent negatives include no chills, coughing, ear pain, headaches, hoarse voice, neck pain, shortness of breath or sore throat. The treatment provided mild relief.  Hyperlipidemia  This is a recurrent problem. The current episode started more than 1 year ago. The problem is controlled. Recent lipid tests were reviewed and are normal. She has no history of chronic renal disease. Pertinent negatives include no chest pain, focal weakness, myalgias or shortness of breath. Current antihyperlipidemic treatment includes statins (omega 3). The current treatment provides mild improvement of lipids. There  are no compliance problems.   Gastroesophageal Reflux  She reports no abdominal pain, no chest pain, no coughing, no dysphagia, no heartburn, no hoarse voice, no nausea, no sore throat, no stridor or no wheezing. The problem has been unchanged. The symptoms are aggravated by certain foods. Pertinent negatives include no melena or weight loss.    No problem-specific Assessment & Plan notes found for this encounter.   Past Medical History:  Diagnosis Date  . Allergy   . Anxiety   . Atrial fibrillation (HCC)   . CHF (congestive heart failure) (HCC)   . Depression   . GERD (gastroesophageal reflux disease)   . Hyperlipidemia   . Hypertension     Past Surgical History:  Procedure Laterality Date  . COLONOSCOPY  2006   had 2 polyps- benign    Family History  Problem Relation Age of Onset  . Colon cancer Father   . Heart disease Brother     Social History   Socioeconomic History  . Marital status: Married    Spouse name: Not on file  . Number of children: Not on file  . Years of education: Not on file  . Highest education level: Not on file  Social Needs  . Financial resource strain: Not on file  . Food insecurity - worry: Not on file  . Food insecurity - inability: Not on file  . Transportation needs - medical: Not on file  . Transportation needs - non-medical: Not on file  Occupational History  . Not on file  Tobacco Use  . Smoking status: Former Smoker    Last attempt to quit: 01/17/1983    Years since quitting: 34.0  . Smokeless tobacco: Former Neurosurgeon  Substance and Sexual Activity  . Alcohol use: No    Alcohol/week: 0.0 oz  . Drug use: No  . Sexual activity: No  Other Topics Concern  . Not on file  Social History Narrative  . Not on file    No Known Allergies  Outpatient Medications Prior to Visit  Medication Sig Dispense Refill  . ALPRAZolam (XANAX) 0.25 MG tablet Take 0.25 mg by mouth 2 (two) times daily. [facility]    . Multiple Vitamins-Minerals (MULTIVITAL  PO) Take 1 tablet by mouth daily.    . rivaroxaban (XARELTO) 20 MG TABS tablet Take 1 tablet by mouth daily. Dr Allena Katz    . venlafaxine XR (EFFEXOR-XR) 75 MG 24 hr capsule Take 1 capsule (75 mg total) by mouth daily with breakfast. (Patient taking differently: Take 75 mg by mouth daily with breakfast. [facility]) 90 capsule 1  . amLODipine (NORVASC) 5 MG tablet Take 1 tablet (5 mg total) by mouth daily. 90 tablet 1  . losartan (COZAAR) 100 MG tablet Take 1 tablet (100 mg total) by mouth daily. 90 tablet 2  . metoprolol tartrate (LOPRESSOR) 100 MG tablet Take 1 tablet (100 mg total) by mouth 2 (two) times daily. 180 tablet 1  . Omega-3 Fatty Acids (FISH OIL) 1200 MG CAPS Take 1 capsule (1,200 mg total) by mouth daily. 30 capsule 11  . simvastatin (ZOCOR) 40 MG tablet Take 1 tablet (40 mg total) by mouth daily. 90 tablet 1  . amoxicillin (AMOXIL) 500 MG capsule Take 1 capsule (500 mg total) by mouth 3 (three) times daily. 30 capsule 0   No facility-administered medications prior to visit.     Review of Systems  Constitutional: Positive for diaphoresis. Negative for chills, fever, malaise/fatigue and weight loss.  HENT: Positive for congestion and sinus pressure. Negative for ear discharge, ear pain, hoarse voice and sore throat.   Eyes: Negative for blurred vision.  Respiratory: Negative for cough, sputum production, shortness of breath and wheezing.   Cardiovascular: Negative for chest pain, palpitations, orthopnea, leg swelling and PND.  Gastrointestinal: Negative for abdominal pain, blood in stool, constipation, diarrhea, dysphagia, heartburn, melena and nausea.  Genitourinary: Negative for dysuria, frequency, hematuria and urgency.  Musculoskeletal: Negative for back pain, joint pain, myalgias and neck pain.  Skin: Negative for rash.  Neurological: Negative for dizziness, tingling, sensory change, focal weakness and headaches.  Endo/Heme/Allergies: Negative for environmental allergies and  polydipsia. Does not bruise/bleed easily.  Psychiatric/Behavioral: Negative for depression and suicidal ideas. The patient is not nervous/anxious and does not have insomnia.      Objective  Vitals:   01/26/17 1541  BP: 130/80  Pulse: 78  Weight: 190 lb (86.2 kg)  Height: 5\' 4"  (1.626 m)    Physical Exam  Constitutional: She is well-developed, well-nourished, and in no distress. No distress.  HENT:  Head: Normocephalic and atraumatic.  Right Ear: External ear normal.  Left Ear: External ear normal.  Nose: Nose normal.  Mouth/Throat: Oropharynx is clear and moist.  Eyes: Conjunctivae and EOM are normal. Pupils are equal, round, and reactive to light. Right eye exhibits no discharge. Left eye exhibits no discharge.  Neck: Normal range of motion. Neck supple. No JVD present. No thyromegaly present.  Cardiovascular: Normal rate, regular rhythm, normal heart sounds and intact distal pulses. Exam reveals no gallop and no friction rub.  No murmur heard. Pulmonary/Chest: Effort normal and breath sounds normal. She has no wheezes. She has no rales.  Abdominal: Soft. Bowel sounds are normal. She  exhibits no mass. There is no tenderness. There is no guarding.  Musculoskeletal: Normal range of motion. She exhibits no edema.  Lymphadenopathy:    She has no cervical adenopathy.  Neurological: She is alert. She has normal reflexes.  Skin: Skin is warm and dry. She is not diaphoretic.  Psychiatric: Mood and affect normal.  Nursing note and vitals reviewed.     Assessment & Plan  Problem List Items Addressed This Visit      Cardiovascular and Mediastinum   Essential hypertension - Primary   Relevant Medications   metoprolol tartrate (LOPRESSOR) 100 MG tablet   losartan (COZAAR) 100 MG tablet   amLODipine (NORVASC) 5 MG tablet   simvastatin (ZOCOR) 40 MG tablet     Other   Hyperlipidemia   Relevant Medications   metoprolol tartrate (LOPRESSOR) 100 MG tablet   losartan (COZAAR)  100 MG tablet   amLODipine (NORVASC) 5 MG tablet   Omega-3 Fatty Acids (FISH OIL) 1200 MG CAPS   simvastatin (ZOCOR) 40 MG tablet    Other Visit Diagnoses    Allergic rhinitis, unspecified seasonality, unspecified trigger       Viral upper respiratory tract infection       nasocort   Gastroesophageal reflux disease, esophagitis presence not specified          Meds ordered this encounter  Medications  . metoprolol tartrate (LOPRESSOR) 100 MG tablet    Sig: Take 1 tablet (100 mg total) by mouth 2 (two) times daily.    Dispense:  180 tablet    Refill:  1  . losartan (COZAAR) 100 MG tablet    Sig: Take 1 tablet (100 mg total) by mouth daily.    Dispense:  90 tablet    Refill:  2  . amLODipine (NORVASC) 5 MG tablet    Sig: Take 1 tablet (5 mg total) by mouth daily.    Dispense:  90 tablet    Refill:  1  . Omega-3 Fatty Acids (FISH OIL) 1200 MG CAPS    Sig: Take 1 capsule (1,200 mg total) by mouth daily.    Dispense:  30 capsule    Refill:  11  . simvastatin (ZOCOR) 40 MG tablet    Sig: Take 1 tablet (40 mg total) by mouth daily.    Dispense:  90 tablet    Refill:  1      Dr. Elizabeth [facility]uereanna Jones [facility]  01/26/17

## 2017-02-16 ENCOUNTER — Encounter: Payer: Self-pay | Admitting: Emergency Medicine

## 2017-02-16 ENCOUNTER — Emergency Department: Payer: Medicare Other

## 2017-02-16 ENCOUNTER — Other Ambulatory Visit: Payer: Self-pay

## 2017-02-16 ENCOUNTER — Emergency Department
Admission: EM | Admit: 2017-02-16 | Discharge: 2017-02-16 | Disposition: A | Discharge status: Home / Self Care | Payer: Medicare Other | Attending: Student in an Organized Health Care Education/Training Program | Admitting: Student in an Organized Health Care Education/Training Program

## 2017-02-16 DIAGNOSIS — Z79899 Other long term (current) drug therapy: Secondary | ICD-10-CM | POA: Diagnosis not present

## 2017-02-16 DIAGNOSIS — I110 Hypertensive heart disease with heart failure: Secondary | ICD-10-CM | POA: Diagnosis not present

## 2017-02-16 DIAGNOSIS — Z87891 Personal history of nicotine dependence: Secondary | ICD-10-CM | POA: Diagnosis not present

## 2017-02-16 DIAGNOSIS — F329 Major depressive disorder, single episode, unspecified: Secondary | ICD-10-CM | POA: Insufficient documentation

## 2017-02-16 DIAGNOSIS — I480 Paroxysmal atrial fibrillation: Secondary | ICD-10-CM | POA: Diagnosis not present

## 2017-02-16 DIAGNOSIS — F419 Anxiety disorder, unspecified: Secondary | ICD-10-CM | POA: Insufficient documentation

## 2017-02-16 DIAGNOSIS — I509 Heart failure, unspecified: Secondary | ICD-10-CM | POA: Insufficient documentation

## 2017-02-16 DIAGNOSIS — I4891 Unspecified atrial fibrillation: Secondary | ICD-10-CM

## 2017-02-16 DIAGNOSIS — R002 Palpitations: Secondary | ICD-10-CM | POA: Diagnosis present

## 2017-02-16 DIAGNOSIS — Z7901 Long term (current) use of anticoagulants: Secondary | ICD-10-CM | POA: Insufficient documentation

## 2017-02-16 LAB — BASIC METABOLIC PANEL
Anion gap: 10 (ref 5–15)
BUN: 25 mg/dL — ABNORMAL HIGH (ref 6–20)
CO2: 22 mmol/L (ref 22–32)
Calcium: 9.2 mg/dL (ref 8.9–10.3)
Chloride: 103 mmol/L (ref 101–111)
Creatinine, Ser: 0.91 mg/dL (ref 0.44–1.00)
GFR calc Af Amer: >60 mL/min (ref >60)
GFR calc non Af Amer: 58 mL/min — ABNORMAL LOW (ref >60)
Glucose, Bld: 231 mg/dL — ABNORMAL HIGH (ref 65–99)
Potassium: 3.7 mmol/L (ref 3.5–5.1)
Sodium: 135 mmol/L (ref 135–145)

## 2017-02-16 LAB — TROPONIN I: Troponin I: <0.03 ng/mL (ref <0.03)

## 2017-02-16 LAB — CBC
HCT: 38.4 % (ref 35.0–47.0)
Hemoglobin: 13.1 g/dL (ref 12.0–16.0)
MCH: 30.9 pg (ref 26.0–34.0)
MCHC: 34 g/dL (ref 32.0–36.0)
MCV: 90.8 fL (ref 80.0–100.0)
Platelets: 271 10*3/uL (ref 150–440)
RBC: 4.23 MIL/uL (ref 3.80–5.20)
RDW: 13.4 % (ref 11.5–14.5)
WBC: 13 10*3/uL — ABNORMAL HIGH (ref 3.6–11.0)

## 2017-02-16 MED ORDER — METOPROLOL TARTRATE 5 MG/5ML IV SOLN
5.0000 mg | Freq: Once | INTRAVENOUS | Status: AC
  Administered 2017-02-16: 5 mg via INTRAVENOUS
  Filled 2017-02-16: qty 5

## 2017-02-16 MED ORDER — DILTIAZEM HCL ER COATED BEADS 120 MG PO CP24
120.0000 mg | ORAL_CAPSULE | Freq: Once | ORAL | Status: AC
  Administered 2017-02-16: 120 mg via ORAL
  Filled 2017-02-16: qty 1

## 2017-02-16 MED ORDER — METOPROLOL TARTRATE 5 MG/5ML IV SOLN: 5.0000 mg | Freq: Once | INTRAVENOUS | Status: DC

## 2017-02-16 MED ORDER — DILTIAZEM HCL 25 MG/5ML IV SOLN
10.0000 mg | Freq: Once | INTRAVENOUS | Status: AC
  Administered 2017-02-16: 10 mg via INTRAVENOUS
  Filled 2017-02-16: qty 5

## 2017-02-16 MED ORDER — DILTIAZEM HCL ER COATED BEADS 120 MG PO CP24: 120.0000 mg | ORAL_CAPSULE | Freq: Every day | ORAL | 0 refills | Status: DC

## 2017-02-16 MED ORDER — DILTIAZEM HCL 100 MG IV SOLR
5.0000 mg/h | Freq: Once | INTRAVENOUS | Status: DC
  Filled 2017-02-16: qty 100

## 2017-02-16 MED ORDER — MAGNESIUM SULFATE 2 GM/50ML IV SOLN
2.0000 g | Freq: Once | INTRAVENOUS | Status: AC
  Administered 2017-02-16: 2 g via INTRAVENOUS
  Filled 2017-02-16: qty 50

## 2017-02-16 MED ORDER — METOPROLOL TARTRATE 50 MG PO TABS
100.0000 mg | ORAL_TABLET | Freq: Once | ORAL | Status: AC
Start: 2017-02-16 — End: 2017-02-16
  Administered 2017-02-16: 100 mg via ORAL
  Filled 2017-02-16: qty 2

## 2017-02-16 NOTE — Discharge Instructions (Signed)
Return immediately for worsening shortness of breath palpitations or for any additional questions or concerns.  Please follow-up with PCP and cardiology as soon as possible.

## 2017-02-16 NOTE — ED Notes (Signed)
Per EDP, hold Darizem drip at this time

## 2017-02-16 NOTE — ED Notes (Signed)

## 2017-02-16 NOTE — ED Triage Notes (Signed)
Pt to ED via POV. Pt states that she was getting ready to go out to eat and started having palpitation. Pt states that she felt short of breath. Pt states that she has been having palpitations for a while. Pt in NAD at this time.

## 2017-02-16 NOTE — ED Provider Notes (Signed)
[facility] Emergency Department Provider Note    None    (approximate)  I have reviewed the triage vital signs and the nursing notes.   HISTORY  Chief Complaint Palpitations    HPI Kristina Wagner is a 83 y.o. female with a history of A. fib on Xarelto and Lopressor presents for palpitations and funny feeling in her chest that occurred while she was on the road to go eat dinner.  Denies any shortness of breath or chest pain.  Denies missing any medications.  Did feel well in route.  Lightheaded faint.  Denies any numbness or tingling.  No recent fevers.  No nausea or vomiting.  Past Medical History:  Diagnosis Date  . Allergy   . Anxiety   . Atrial fibrillation (HCC)   . CHF (congestive heart failure) (HCC)   . Depression   . GERD (gastroesophageal reflux disease)   . Hyperlipidemia   . Hypertension    Family History  Problem Relation Age of Onset  . Colon cancer Father   . Heart disease Brother    Past Surgical History:  Procedure Laterality Date  . COLONOSCOPY  2006   had 2 polyps- benign   Patient Active Problem List   Diagnosis Date Noted  . Essential hypertension 10/19/2016  . Thyromegaly 12/22/2015  . Hyperlipidemia 10/13/2015      Prior to Admission medications   Medication Sig Start Date End Date Taking? Authorizing Provider  ALPRAZolam (XANAX) 0.25 MG tablet Take 0.25 mg by mouth 2 (two) times daily. [facility] 10/26/15   [provider]  amLODipine (NORVASC) 5 MG tablet Take 1 tablet (5 mg total) by mouth daily. 01/26/17   Duanne Limerick, MD  diltiazem (CARDIZEM CD) 120 MG 24 hr capsule Take 1 capsule (120 mg total) by mouth daily. 02/16/17   Willy Eddy, MD  losartan (COZAAR) 100 MG tablet Take 1 tablet (100 mg total) by mouth daily. 01/26/17   Duanne Limerick, MD  metoprolol tartrate (LOPRESSOR) 100 MG tablet Take 1 tablet (100 mg total) by mouth 2 (two) times daily. 01/26/17   Duanne Limerick, MD  Multiple  Vitamins-Minerals (MULTIVITAL PO) Take 1 tablet by mouth daily.    [provider]  Omega-3 Fatty Acids (FISH OIL) 1200 MG CAPS Take 1 capsule (1,200 mg total) by mouth daily. 01/26/17   Duanne Limerick, MD  rivaroxaban (XARELTO) 20 MG TABS tablet Take 1 tablet by mouth daily. Dr Allena Katz 09/01/14   [provider]  simvastatin (ZOCOR) 40 MG tablet Take 1 tablet (40 mg total) by mouth daily. 01/26/17   Duanne Limerick, MD  venlafaxine XR (EFFEXOR-XR) 75 MG 24 hr capsule Take 1 capsule (75 mg total) by mouth daily with breakfast. Patient taking differently: Take 75 mg by mouth daily with breakfast. [facility] 10/13/15   Duanne Limerick, MD    Allergies Patient has no known allergies.    Social History Social History   Tobacco Use  . Smoking status: Former Smoker    Last attempt to quit: 01/17/1983    Years since quitting: 34.1  . Smokeless tobacco: Former Engineer, water Use Topics  . Alcohol use: No    Alcohol/week: 0.0 oz  . Drug use: No    Review of Systems Patient denies headaches, rhinorrhea, blurry vision, numbness, shortness of breath, chest pain, edema, cough, abdominal pain, nausea, vomiting, diarrhea, dysuria, fevers, rashes or hallucinations unless otherwise stated above in HPI. ____________________________________________   PHYSICAL EXAM:  VITAL SIGNS: Vitals:   02/16/17 2304 02/16/17 2319  BP:  126/73  Pulse: 83 85  Resp: (!) 21 19  Temp:    SpO2: 97% 98%    Constitutional: Alert and oriented. Well appearing and in no acute distress. Eyes: Conjunctivae are normal.  Head: Atraumatic. Nose: No congestion/rhinnorhea. Mouth/Throat: Mucous membranes are moist.   Neck: No stridor. Painless ROM.  Cardiovascular: irregularly irregular tachycardic rhythm. Grossly normal heart sounds.  Good peripheral circulation. Respiratory: Normal respiratory effort.  No retractions. Lungs CTAB. Gastrointestinal: Soft and nontender. No distention. No abdominal bruits. No CVA  tenderness. Genitourinary:  Musculoskeletal: No lower extremity tenderness nor edema.  No joint effusions. Neurologic:  Normal speech and language. No gross focal neurologic deficits are appreciated. No facial droop Skin:  Skin is warm, dry and intact. No rash noted. Psychiatric: Mood and affect are normal. Speech and behavior are normal.  ____________________________________________   LABS (all labs ordered are listed, but only abnormal results are displayed)  Results for orders placed or performed during the hospital encounter of 02/16/17 (from the past 24 hour(s))  Basic metabolic panel     Status: Abnormal   Collection Time: 02/16/17  6:40 PM  Result Value Ref Range   Sodium 135 135 - 145 mmol/L   Potassium 3.7 3.5 - 5.1 mmol/L   Chloride 103 101 - 111 mmol/L   CO2 22 22 - 32 mmol/L   Glucose, Bld 231 (H) 65 - 99 mg/dL   BUN 25 (H) 6 - 20 mg/dL   Creatinine, Ser 0.450.91 0.44 - 1.00 mg/dL   Calcium 9.2 8.9 - 40.910.3 mg/dL   GFR calc non Af Amer 58 (L) >60 mL/min   GFR calc Af Amer >60 >60 mL/min   Anion gap 10 5 - 15  CBC     Status: Abnormal   Collection Time: 02/16/17  6:40 PM  Result Value Ref Range   WBC 13.0 (H) 3.6 - 11.0 K/uL   RBC 4.23 3.80 - 5.20 MIL/uL   Hemoglobin 13.1 12.0 - 16.0 g/dL   HCT 81.138.4 91.435.0 - 78.247.0 %   MCV 90.8 80.0 - 100.0 fL   MCH 30.9 26.0 - 34.0 pg   MCHC 34.0 32.0 - 36.0 g/dL   RDW 95.613.4 21.311.5 - 08.614.5 %   Platelets 271 150 - 440 K/uL  Troponin I     Status: None   Collection Time: 02/16/17  6:40 PM  Result Value Ref Range   Troponin I <0.03 <0.03 ng/mL   ____________________________________________  EKG My review and personal interpretation at Time: 18:36   Indication: palpitations  Rate: 18:36  Rhythm: afib with rvr Axis: normal Other: nonspecific st changfes likely rate dependent  My review and personal interpretation at Time: 21:37   Indication: palpitations  Rate: 75 Rhythm: sinus Axis: normal Other: no stemi, stchanges  resolved ____________________________________________  RADIOLOGY  I personally reviewed all radiographic images ordered to evaluate for the above acute complaints and reviewed radiology reports and findings.  These findings were personally discussed with the patient.  Please see medical record for radiology report.  ____________________________________________   PROCEDURES  Procedure(s) performed:  .Critical Care Performed by: Willy Eddyobinson, Korie Brabson, MD Authorized by: Willy Eddyobinson, Tahnee Cifuentes, MD   Critical care provider statement:    Critical care time (minutes):  30   Critical care time was exclusive of:  Separately billable procedures and treating other patients   Critical care was time spent personally by me on the following activities:  Development of treatment  plan with patient or surrogate, discussions with consultants, evaluation of patient's response to treatment, examination of patient, obtaining history from patient or surrogate, ordering and performing treatments and interventions, ordering and review of laboratory studies, ordering and review of radiographic studies, pulse oximetry, re-evaluation of patient's condition and review of old charts      Critical Care performed: yes ____________________________________________   INITIAL IMPRESSION / ASSESSMENT AND PLAN / ED COURSE  Pertinent labs & imaging results that were available during my care of the patient were reviewed by me and considered in my medical decision making (see chart for details).  DDX: Dysrhythmia, to light abnormality, medication compliance, ACS, sepsis  Kristina Wagner is a 83 y.o. who presents to the ED with palpitations with evidence of A. fib with RVR.  Patient is anticoagulated on Xarelto for history of A. fib.  Denies any chest pain but does feel weak.  Based on her use of Lopressor as an outpatient will give oral metoprolol as well as IV doses.  We will give IV magnesium as well as IV fluids.  We  will check blood work for the above differential.  Seems less consistent with ACS.  Clinical Course as of Feb 16 2337  Fri Feb 16, 2017  2027 Blood pressures remained stable patient still with rapid A. fib.  Will give 1 more dose of IV Lopressor and if still with no rate control will switch over to Cardizem.  [PR]  2141 Patient converted to sinus rhythm after single bolus of IV Cardizem.  Remains hemodynamically stable.  States that she feels much better.  Will observe in the ER.  [PR]    Clinical Course User Index [PR] Willy Eddy, MD   Patient remained in sinus rhythm and well-appearing able to ambulate with no distress.  Spoke with Dr. Darrold Junker of cardiology regarding medication recommendations given her significant response to Cardizem but being on a beta-blocker at home.  States would be appropriate to initiate low-dose Cardizem 120 CD follow-up outpatient clinic.  Patient agrees with this plan.  Given initiation dose here in the ER.  Have discussed with the patient and available family all diagnostics and treatments performed thus far and all questions were answered to the best of my ability. The patient demonstrates understanding and agreement with plan.   ____________________________________________   FINAL CLINICAL IMPRESSION(S) / ED DIAGNOSES  Final diagnoses:  Atrial fibrillation with rapid ventricular response (HCC)      NEW MEDICATIONS STARTED DURING THIS VISIT:  New Prescriptions   DILTIAZEM (CARDIZEM CD) 120 MG 24 HR CAPSULE    Take 1 capsule (120 mg total) by mouth daily.     Note:  This document was prepared using Dragon voice recognition software and may include unintentional dictation errors.    Willy Eddy, MD 02/16/17 [phone]

## 2017-05-08 ENCOUNTER — Other Ambulatory Visit: Payer: Self-pay

## 2017-05-08 ENCOUNTER — Ambulatory Visit
Admission: EM | Admit: 2017-05-08 | Discharge: 2017-05-08 | Disposition: A | Discharge status: Home / Self Care | Payer: Medicare Other | Attending: Family Medicine | Admitting: Family Medicine

## 2017-05-08 ENCOUNTER — Encounter: Payer: Self-pay | Admitting: Gynecology

## 2017-05-08 DIAGNOSIS — F329 Major depressive disorder, single episode, unspecified: Secondary | ICD-10-CM | POA: Diagnosis not present

## 2017-05-08 DIAGNOSIS — I160 Hypertensive urgency: Secondary | ICD-10-CM | POA: Diagnosis not present

## 2017-05-08 DIAGNOSIS — R739 Hyperglycemia, unspecified: Secondary | ICD-10-CM | POA: Diagnosis not present

## 2017-05-08 DIAGNOSIS — F32A Depression, unspecified: Secondary | ICD-10-CM

## 2017-05-08 LAB — BASIC METABOLIC PANEL
Anion gap: 11 (ref 5–15)
BUN: 16 mg/dL (ref 6–20)
CO2: 24 mmol/L (ref 22–32)
Calcium: 9.3 mg/dL (ref 8.9–10.3)
Chloride: 101 mmol/L (ref 101–111)
Creatinine, Ser: 0.75 mg/dL (ref 0.44–1.00)
GFR calc Af Amer: >60 mL/min (ref >60)
GFR calc non Af Amer: >60 mL/min (ref >60)
Glucose, Bld: 207 mg/dL — ABNORMAL HIGH (ref 65–99)
Potassium: 3.7 mmol/L (ref 3.5–5.1)
Sodium: 136 mmol/L (ref 135–145)

## 2017-05-08 MED ORDER — VALSARTAN 320 MG PO TABS: 320.0000 mg | ORAL_TABLET | Freq: Every day | ORAL | 0 refills | Status: DC

## 2017-05-08 MED ORDER — VENLAFAXINE HCL ER 150 MG PO CP24: 150.0000 mg | ORAL_CAPSULE | Freq: Every day | ORAL | 0 refills | Status: DC

## 2017-05-08 NOTE — Discharge Instructions (Signed)
I have increased the valsartan and your Effexor.  Be sure to see Jones.  Take care  Dr. Adriana Simasook

## 2017-05-08 NOTE — ED Triage Notes (Signed)
Per patient took her blood pressure today stated it was 200/158. Per patient under a lot stress since her husband pass away.. Pt. Stated seeing her primary care doctor on Friday.

## 2017-05-08 NOTE — ED Provider Notes (Signed)
[facility]    CSN: [id number] Arrival date & time: 05/08/17  1754  History   Chief Complaint Chief Complaint  Patient presents with  . Hypertension   HPI  83 year old female presents with elevated blood pressure.  Patient states she took her blood pressure earlier today and it was markedly elevated.  Patient states that it was in the 200s systolic.  She endorses compliance with her home blood pressure medication: Diltiazem, metoprolol, Lasix, Diovan.  She does note that her losartan was recently changed to Diovan.  No chest pain.  No shortness of breath.  Patient states that she feels fatigued and has little interest in doing things.  Patient states that she is under a great deal of stress.  She lost her husband last year and is quite overwhelmed.  Patient has lack of interest in doing things.  Seems depressed.  No other reported symptoms.  No other complaints or concerns at this time.  Past Medical History:  Diagnosis Date  . Allergy   . Anxiety   . Atrial fibrillation (HCC)   . CHF (congestive heart failure) (HCC)   . Depression   . GERD (gastroesophageal reflux disease)   . Hyperlipidemia   . Hypertension     Patient Active Problem List   Diagnosis Date Noted  . Essential hypertension 10/19/2016  . Thyromegaly 12/22/2015  . Hyperlipidemia 10/13/2015    Past Surgical History:  Procedure Laterality Date  . COLONOSCOPY  2006   had 2 polyps- benign    OB History   None      Home Medications    Prior to Admission medications   Medication Sig Start Date End Date Taking? Authorizing Provider  ALPRAZolam (XANAX) 0.25 MG tablet Take 0.25 mg by mouth 2 (two) times daily. [facility] 10/26/15  Yes [provider]  diltiazem (CARDIZEM CD) 120 MG 24 hr capsule Take 1 capsule (120 mg total) by mouth daily. 02/16/17  Yes Willy Eddyobinson, Patrick, MD  metoprolol tartrate (LOPRESSOR) 100 MG tablet Take 1 tablet (100 mg total) by mouth 2 (two) times daily. 01/26/17  Yes  Duanne LimerickJones, Deanna C, MD  Multiple Vitamins-Minerals (MULTIVITAL PO) Take 1 tablet by mouth daily.   Yes [provider]  Omega-3 Fatty Acids (FISH OIL) 1200 MG CAPS Take 1 capsule (1,200 mg total) by mouth daily. 01/26/17  Yes Duanne LimerickJones, Deanna C, MD  rivaroxaban (XARELTO) 20 MG TABS tablet Take 1 tablet by mouth daily. Dr Allena KatzPatel 09/01/14  Yes [provider]  simvastatin (ZOCOR) 40 MG tablet Take 1 tablet (40 mg total) by mouth daily. 01/26/17  Yes Duanne LimerickJones, Deanna C, MD  valsartan (DIOVAN) 320 MG tablet Take 1 tablet (320 mg total) by mouth daily. 05/08/17   Tommie Samsook, Detra Bores G, DO  venlafaxine XR (EFFEXOR-XR) 150 MG 24 hr capsule Take 1 capsule (150 mg total) by mouth daily with breakfast. 05/08/17   Tommie Samsook, Carr Shartzer G, DO    Family History Family History  Problem Relation Age of Onset  . Colon cancer Father   . Heart disease Brother     Social History Social History   Tobacco Use  . Smoking status: Former Smoker    Last attempt to quit: 01/17/1983    Years since quitting: 34.3  . Smokeless tobacco: Former Engineer, waterUser  Substance Use Topics  . Alcohol use: No    Alcohol/week: 0.0 oz  . Drug use: No     Allergies   Patient has no known allergies.   Review of Systems Review of  Systems  Constitutional: Positive for fatigue.  Respiratory: Negative for shortness of breath.   Cardiovascular: Negative for chest pain.       Elevated BP.  Psychiatric/Behavioral: The patient is nervous/anxious.    Physical Exam Triage Vital Signs ED Triage Vitals  Enc Vitals Group     BP 05/08/17 1803 (!) 189/110     Pulse Rate 05/08/17 1803 74     Resp --      Temp 05/08/17 1803 98.4 F (36.9 C)     Temp Source 05/08/17 1803 Oral     SpO2 05/08/17 1803 97 %     Weight 05/08/17 1805 190 lb (86.2 kg)     Height 05/08/17 1805 5\' 5"  (1.651 m)     Head Circumference --      Peak Flow --      Pain Score 05/08/17 1804 0     Pain Loc --      Pain Edu? --      Excl. in GC? --    Updated Vital Signs BP  (!) 180/92 (BP Location: Left Arm)   Pulse 74   Temp 98.4 F (36.9 C) (Oral)   Ht 5\' 5"  (1.651 m)   Wt 190 lb (86.2 kg)   SpO2 97%   BMI 31.62 kg/m  Physical Exam  Constitutional: She is oriented to person, place, and time. She appears well-developed. No distress.  HENT:  Head: Normocephalic and atraumatic.  Cardiovascular: Normal rate and regular rhythm.  Pulmonary/Chest: Effort normal and breath sounds normal. She has no wheezes. She has no rales.  Neurological: She is alert and oriented to person, place, and time.  Psychiatric: Her behavior is normal.  Flat affect. Depressed.  Nursing note and vitals reviewed.  UC Treatments / Results  Labs (all labs ordered are listed, but only abnormal results are displayed) Labs Reviewed  BASIC METABOLIC PANEL - Abnormal; Notable for the following components:      Result Value   Glucose, Bld 207 (*)    All other components within normal limits    EKG None Radiology No results found.  Procedures Procedures (including critical care time)  Medications Ordered in UC Medications - No data to display   Initial Impression / Assessment and Plan / UC Course  I have reviewed the triage vital signs and the nursing notes.  Pertinent labs & imaging results that were available during my care of the patient were reviewed by me and considered in my medical decision making (see chart for details).    83 year old female presents with elevated BP.  Increasing Diovan.  Creatinine normal today.  I suspect that she has ongoing depression as well.  Increasing her Effexor.  Additionally, I informed the patient and her blood glucose was markedly elevated.  I suspect that she has underlying diabetes.  I sent a message to her primary care physician about her ongoing issues and the need for an A1c.  Final Clinical Impressions(s) / UC Diagnoses   Final diagnoses:  Hypertensive urgency  Depression, unspecified depression type  Blood glucose elevated     ED Discharge Orders        Ordered    venlafaxine XR (EFFEXOR-XR) 150 MG 24 hr capsule  Daily with breakfast    Note to Pharmacy:  Next time filling- fill this one (XR) instead of immediate release   05/08/17 1909    valsartan (DIOVAN) 320 MG tablet  Daily     05/08/17 1909     Controlled Substance  Prescriptions [place] Controlled Substance Registry consulted? Not Applicable   Tommie Sams, DO 05/08/17 2032

## 2017-05-11 ENCOUNTER — Encounter: Payer: Self-pay | Admitting: Family Medicine

## 2017-05-11 ENCOUNTER — Ambulatory Visit: Payer: Medicare Other | Admitting: Family Medicine

## 2017-05-11 VITALS — BP 130/80 | HR 80 | Ht 65.0 in | Wt 192.0 lb

## 2017-05-11 DIAGNOSIS — I10 Essential (primary) hypertension: Secondary | ICD-10-CM

## 2017-05-11 DIAGNOSIS — R739 Hyperglycemia, unspecified: Secondary | ICD-10-CM

## 2017-05-11 DIAGNOSIS — F3341 Major depressive disorder, recurrent, in partial remission: Secondary | ICD-10-CM

## 2017-05-11 LAB — POCT CBG (FASTING - GLUCOSE)-MANUAL ENTRY: Glucose Fasting, POC: 170 mg/dL — AB (ref 70–99)

## 2017-05-11 NOTE — Progress Notes (Signed)
Name: Kristina Wagner   MRN: [id number]    DOB: [birth year]   Date:05/11/2017       Progress Note  Subjective  Chief Complaint  Chief Complaint  Patient presents with  . Follow-up    increased Valsartan to 320mg  qday at urgent care due to elevated B/P  . Hyperglycemia    sugar was 204 at urgent care- needs A1C    Hyperglycemia  This is a chronic problem. The current episode started more than 1 year ago. The problem occurs daily. The problem has been gradually worsening. Pertinent negatives include no abdominal pain, anorexia, arthralgias, change in bowel habit, chest pain, chills, congestion, coughing, diaphoresis, fatigue, fever, headaches, joint swelling, myalgias, nausea, neck pain, numbness, rash, sore throat, swollen glands, urinary symptoms, vertigo, visual change, vomiting or weakness. Nothing aggravates the symptoms. The treatment provided moderate relief.    No problem-specific Assessment & Plan notes found for this encounter.   Past Medical History:  Diagnosis Date  . Allergy   . Anxiety   . Atrial fibrillation (HCC)   . CHF (congestive heart failure) (HCC)   . Depression   . GERD (gastroesophageal reflux disease)   . Hyperlipidemia   . Hypertension     Past Surgical History:  Procedure Laterality Date  . COLONOSCOPY  2006   had 2 polyps- benign    Family History  Problem Relation Age of Onset  . Colon cancer Father   . Heart disease Brother     Social History   Socioeconomic History  . Marital status: Widowed    Spouse name: Not on file  . Number of children: Not on file  . Years of education: Not on file  . Highest education level: Not on file  Occupational History  . Not on file  Social Needs  . Financial resource strain: Not on file  . Food insecurity:    Worry: Not on file    Inability: Not on file  . Transportation needs:    Medical: Not on file    Non-medical: Not on file  Tobacco Use  . Smoking status: Former Smoker    Last attempt  to quit: 01/17/1983    Years since quitting: 34.3  . Smokeless tobacco: Former Engineer, water and Sexual Activity  . Alcohol use: No    Alcohol/week: 0.0 oz  . Drug use: No  . Sexual activity: Never  Lifestyle  . Physical activity:    Days per week: Not on file    Minutes per session: Not on file  . Stress: Not on file  Relationships  . Social connections:    Talks on phone: Not on file    Gets together: Not on file    Attends religious service: Not on file    Active member of club or organization: Not on file    Attends meetings of clubs or organizations: Not on file    Relationship status: Not on file  . Intimate partner violence:    Fear of current or ex partner: Not on file    Emotionally abused: Not on file    Physically abused: Not on file    Forced sexual activity: Not on file  Other Topics Concern  . Not on file  Social History Narrative  . Not on file    No Known Allergies  Outpatient Medications Prior to Visit  Medication Sig Dispense Refill  . ALPRAZolam (XANAX) 0.25 MG tablet Take 0.25 mg by mouth 2 (two) times daily. [facility]    .  diltiazem (CARDIZEM CD) 120 MG 24 hr capsule Take 1 capsule (120 mg total) by mouth daily. 30 capsule 0  . metoprolol tartrate (LOPRESSOR) 100 MG tablet Take 1 tablet (100 mg total) by mouth 2 (two) times daily. 180 tablet 1  . Multiple Vitamins-Minerals (MULTIVITAL PO) Take 1 tablet by mouth daily.    . Omega-3 Fatty Acids (FISH OIL) 1200 MG CAPS Take 1 capsule (1,200 mg total) by mouth daily. 30 capsule 11  . rivaroxaban (XARELTO) 20 MG TABS tablet Take 1 tablet by mouth daily. Dr Allena KatzPatel    . simvastatin (ZOCOR) 40 MG tablet Take 1 tablet (40 mg total) by mouth daily. 90 tablet 1  . valsartan (DIOVAN) 320 MG tablet Take 1 tablet (320 mg total) by mouth daily. 90 tablet 0  . venlafaxine XR (EFFEXOR-XR) 150 MG 24 hr capsule Take 1 capsule (150 mg total) by mouth daily with breakfast. 90 capsule 0   No facility-administered medications  prior to visit.     Review of Systems  Constitutional: Negative for chills, diaphoresis, fatigue, fever, malaise/fatigue and weight loss.  HENT: Negative for congestion, ear discharge, ear pain and sore throat.   Eyes: Negative for blurred vision.  Respiratory: Negative for cough, sputum production, shortness of breath and wheezing.   Cardiovascular: Negative for chest pain, palpitations and leg swelling.  Gastrointestinal: Negative for abdominal pain, anorexia, blood in stool, change in bowel habit, constipation, diarrhea, heartburn, melena, nausea and vomiting.  Genitourinary: Negative for dysuria, frequency, hematuria and urgency.  Musculoskeletal: Negative for arthralgias, back pain, joint pain, joint swelling, myalgias and neck pain.  Skin: Negative for rash.  Neurological: Negative for dizziness, vertigo, tingling, sensory change, focal weakness, weakness, numbness and headaches.  Endo/Heme/Allergies: Negative for environmental allergies and polydipsia. Does not bruise/bleed easily.  Psychiatric/Behavioral: Negative for depression and suicidal ideas. The patient is not nervous/anxious and does not have insomnia.      Objective  Vitals:   05/11/17 1334  BP: 130/80  Pulse: 80  Weight: 192 lb (87.1 kg)  Height: 5\' 5"  (1.651 m)    Physical Exam  Constitutional: No distress.  HENT:  Head: Normocephalic and atraumatic.  Right Ear: External ear normal.  Left Ear: External ear normal.  Nose: Nose normal.  Mouth/Throat: Oropharynx is clear and moist.  Eyes: Pupils are equal, round, and reactive to light. Conjunctivae and EOM are normal. Right eye exhibits no discharge. Left eye exhibits no discharge.  Neck: Normal range of motion. Neck supple. No JVD present. No thyromegaly present.  Cardiovascular: Normal rate, regular rhythm, normal heart sounds and intact distal pulses. Exam reveals no gallop and no friction rub.  No murmur heard. Pulmonary/Chest: Effort normal and breath  sounds normal.  Abdominal: Soft. Bowel sounds are normal. She exhibits no mass. There is no tenderness. There is no guarding.  Musculoskeletal: Normal range of motion. She exhibits no edema.  Lymphadenopathy:    She has no cervical adenopathy.  Neurological: She is alert. She has normal reflexes.  Skin: Skin is warm and dry. She is not diaphoretic.  Nursing note and vitals reviewed.     Assessment & Plan  Problem List Items Addressed This Visit      Cardiovascular and Mediastinum   Essential hypertension - Primary    Other Visit Diagnoses    Hyperglycemia       Relevant Orders   Hemoglobin A1c   POCT CBG (Fasting - Glucose) (Completed)   Recurrent major depressive disorder, in partial remission (HCC)  No orders of the defined types were placed in this encounter.     Dr. Hayden [facility]  05/11/17

## 2017-05-12 LAB — HEMOGLOBIN A1C
Est. average glucose Bld gHb Est-mCnc: 223 mg/dL
Hgb A1c MFr Bld: 9.4 % — ABNORMAL HIGH (ref 4.8–5.6)

## 2017-05-15 ENCOUNTER — Other Ambulatory Visit: Payer: Self-pay

## 2017-05-15 MED ORDER — METFORMIN HCL 500 MG PO TABS: 500.0000 mg | ORAL_TABLET | Freq: Two times a day (BID) | ORAL | 0 refills | Status: DC

## 2017-05-15 NOTE — Progress Notes (Unsigned)
metformin

## 2017-05-24 LAB — HM DIABETES EYE EXAM

## 2017-05-28 ENCOUNTER — Other Ambulatory Visit: Payer: Self-pay

## 2017-05-29 ENCOUNTER — Ambulatory Visit: Payer: Medicare Other | Admitting: Family Medicine

## 2017-05-29 ENCOUNTER — Encounter: Payer: Self-pay | Admitting: Family Medicine

## 2017-05-29 VITALS — BP 120/80 | HR 80 | Ht 65.0 in | Wt 187.0 lb

## 2017-05-29 DIAGNOSIS — I10 Essential (primary) hypertension: Secondary | ICD-10-CM | POA: Diagnosis not present

## 2017-05-29 DIAGNOSIS — E782 Mixed hyperlipidemia: Secondary | ICD-10-CM

## 2017-05-29 DIAGNOSIS — E119 Type 2 diabetes mellitus without complications: Secondary | ICD-10-CM | POA: Diagnosis not present

## 2017-05-29 MED ORDER — METFORMIN HCL 500 MG PO TABS: 500.0000 mg | ORAL_TABLET | Freq: Two times a day (BID) | ORAL | 5 refills | Status: DC

## 2017-05-29 NOTE — Progress Notes (Signed)
Name: Kristina Wagner   MRN: [id number]    DOB: 04/18/1934   Date:05/29/2017       Progress Note  Subjective  Chief Complaint  Chief Complaint  Patient presents with  . Diabetes    new diabetic- started metformin  bid on May 2    Diabetes  She presents for her follow-up diabetic visit. She has type 2 diabetes mellitus. Her disease course has been fluctuating (170 mg% range). There are no hypoglycemic associated symptoms. Pertinent negatives for hypoglycemia include no dizziness, headaches or nervousness/anxiousness. Associated symptoms include blurred vision. Pertinent negatives for diabetes include no chest pain, no fatigue, no foot paresthesias, no foot ulcerations, no polydipsia, no polyphagia, no polyuria, no visual change, no weakness and no weight loss. There are no hypoglycemic complications. Symptoms are stable. There are no diabetic complications. Risk factors for coronary artery disease include dyslipidemia and hypertension. Current diabetic treatment includes diet and oral agent (monotherapy). Her weight is stable. She is following a generally healthy diet. Meal planning includes avoidance of concentrated sweets. She participates in exercise intermittently. Her breakfast blood glucose is taken between 8-9 am. Her breakfast blood glucose range is generally 140-180 mg/dl. An ACE inhibitor/angiotensin II receptor blocker is being taken. She does not see a podiatrist.Eye exam is current.    No problem-specific Assessment & Plan notes found for this encounter.   Past Medical History:  Diagnosis Date  . Allergy   . Anxiety   . Atrial fibrillation (HCC)   . CHF (congestive heart failure) (HCC)   . Depression   . GERD (gastroesophageal reflux disease)   . Hyperlipidemia   . Hypertension     Past Surgical History:  Procedure Laterality Date  . COLONOSCOPY  2006   had 2 polyps- benign    Family History  Problem Relation Age of Onset  . Colon cancer Father   . Heart  disease Brother     Social History   Socioeconomic History  . Marital status: Widowed    Spouse name: Not on file  . Number of children: Not on file  . Years of education: Not on file  . Highest education level: Not on file  Occupational History  . Not on file  Social Needs  . Financial resource strain: Not on file  . Food insecurity:    Worry: Not on file    Inability: Not on file  . Transportation needs:    Medical: Not on file    Non-medical: Not on file  Tobacco Use  . Smoking status: Former Smoker    Last attempt to quit: 01/17/1983    Years since quitting: 34.3  . Smokeless tobacco: Former Engineer, water and Sexual Activity  . Alcohol use: No    Alcohol/week: 0.0 oz  . Drug use: No  . Sexual activity: Never  Lifestyle  . Physical activity:    Days per week: Not on file    Minutes per session: Not on file  . Stress: Not on file  Relationships  . Social connections:    Talks on phone: Not on file    Gets together: Not on file    Attends religious service: Not on file    Active member of club or organization: Not on file    Attends meetings of clubs or organizations: Not on file    Relationship status: Not on file  . Intimate partner violence:    Fear of current or ex partner: Not on file    Emotionally  abused: Not on file    Physically abused: Not on file    Forced sexual activity: Not on file  Other Topics Concern  . Not on file  Social History Narrative  . Not on file    No Known Allergies  Outpatient Medications Prior to Visit  Medication Sig Dispense Refill  . ALPRAZolam (XANAX) 0.25 MG tablet Take 0.25 mg by mouth 2 (two) times daily. [facility]    . diltiazem (CARDIZEM CD) 120 MG 24 hr capsule Take 1 capsule (120 mg total) by mouth daily. 30 capsule 0  . metoprolol tartrate (LOPRESSOR) 100 MG tablet Take 1 tablet (100 mg total) by mouth 2 (two) times daily. 180 tablet 1  . Multiple Vitamins-Minerals (MULTIVITAL PO) Take 1 tablet by mouth daily.    .  Omega-3 Fatty Acids (FISH OIL) 1200 MG CAPS Take 1 capsule (1,200 mg total) by mouth daily. 30 capsule 11  . rivaroxaban (XARELTO) 20 MG TABS tablet Take 1 tablet by mouth daily. Dr Allena Katz    . simvastatin (ZOCOR) 40 MG tablet Take 1 tablet (40 mg total) by mouth daily. 90 tablet 1  . valsartan (DIOVAN) 320 MG tablet Take 1 tablet (320 mg total) by mouth daily. 90 tablet 0  . metFORMIN (GLUCOPHAGE) 500 MG tablet Take 1 tablet (500 mg total) by mouth 2 (two) times daily with a meal. 60 tablet 0   No facility-administered medications prior to visit.     Review of Systems  Constitutional: Negative for chills, fatigue, fever, malaise/fatigue and weight loss.  HENT: Negative for ear discharge, ear pain and sore throat.   Eyes: Positive for blurred vision.  Respiratory: Negative for cough, sputum production, shortness of breath and wheezing.   Cardiovascular: Negative for chest pain, palpitations and leg swelling.  Gastrointestinal: Negative for abdominal pain, blood in stool, constipation, diarrhea, heartburn, melena and nausea.  Genitourinary: Negative for dysuria, frequency, hematuria and urgency.  Musculoskeletal: Negative for back pain, joint pain, myalgias and neck pain.  Skin: Negative for rash.  Neurological: Negative for dizziness, tingling, sensory change, focal weakness, weakness and headaches.  Endo/Heme/Allergies: Negative for environmental allergies, polydipsia and polyphagia. Does not bruise/bleed easily.  Psychiatric/Behavioral: Negative for depression and suicidal ideas. The patient is not nervous/anxious and does not have insomnia.      Objective  Vitals:   05/29/17 0920  BP: 120/80  Pulse: 80  Weight: 187 lb (84.8 kg)  Height:  (1.651 m)    Physical Exam  Constitutional: No distress.  HENT:  Head: Normocephalic and atraumatic.  Right Ear: External ear normal.  Left Ear: External ear normal.  Nose: Nose normal.  Mouth/Throat: Oropharynx is clear and moist.   Eyes: Pupils are equal, round, and reactive to light. Conjunctivae and EOM are normal. Right eye exhibits no discharge. Left eye exhibits no discharge.  Neck: Normal range of motion. Neck supple. No JVD present. No thyromegaly present.  Cardiovascular: Normal rate, regular rhythm, normal heart sounds and intact distal pulses. Exam reveals no gallop and no friction rub.  No murmur heard. Pulmonary/Chest: Effort normal and breath sounds normal.  Abdominal: Soft. Bowel sounds are normal. She exhibits no mass. There is no tenderness. There is no guarding.  Musculoskeletal: Normal range of motion. She exhibits no edema.  Lymphadenopathy:    She has no cervical adenopathy.  Neurological: She is alert. She has normal reflexes.  Skin: Skin is warm and dry. She is not diaphoretic.  Nursing note and vitals reviewed.  Assessment & Plan  Problem List Items Addressed This Visit      Cardiovascular and Mediastinum   Essential hypertension     Other   Hyperlipidemia    Other Visit Diagnoses    Type 2 diabetes mellitus without complication, without long-term current use of insulin (HCC)    -  Primary   Relevant Medications   metFORMIN (GLUCOPHAGE) 500 MG tablet      Meds ordered this encounter  Medications  . metFORMIN (GLUCOPHAGE) 500 MG tablet    Sig: Take 1 tablet (500 mg total) by mouth 2 (two) times daily with a meal.    Dispense:  60 tablet    Refill:  5    Dx E11.9   Health risks of being over weight were discussed and patient was counseled on weight loss options and exercise.   Dr. [facility]den [facility]  05/29/17

## 2017-05-29 NOTE — Patient Instructions (Signed)

## 2017-06-18 ENCOUNTER — Other Ambulatory Visit: Payer: Self-pay

## 2017-06-18 ENCOUNTER — Other Ambulatory Visit: Payer: Self-pay | Admitting: Family Medicine

## 2017-06-18 NOTE — Progress Notes (Unsigned)
Called pharm for diab supplies

## 2017-06-19 ENCOUNTER — Other Ambulatory Visit: Payer: Self-pay

## 2017-06-19 NOTE — Progress Notes (Unsigned)
Called pharmacy concerning Rx for test strips- was looking up med

## 2017-07-04 ENCOUNTER — Encounter: Payer: Self-pay | Admitting: Family Medicine

## 2017-07-04 ENCOUNTER — Ambulatory Visit: Payer: Medicare Other | Admitting: Family Medicine

## 2017-07-04 ENCOUNTER — Other Ambulatory Visit: Payer: Self-pay

## 2017-07-04 VITALS — BP 120/74 | HR 72 | Ht 65.0 in | Wt 184.0 lb

## 2017-07-04 DIAGNOSIS — R197 Diarrhea, unspecified: Secondary | ICD-10-CM | POA: Diagnosis not present

## 2017-07-04 DIAGNOSIS — J0100 Acute maxillary sinusitis, unspecified: Secondary | ICD-10-CM | POA: Diagnosis not present

## 2017-07-04 MED ORDER — AZITHROMYCIN 250 MG PO TABS: ORAL_TABLET | ORAL | 0 refills | Status: DC

## 2017-07-04 NOTE — Progress Notes (Signed)
Name: Kristina Wagner   MRN: [id number]    DOB: 1934/04/04   Date:07/04/2017       Progress Note  Subjective  Chief Complaint  Chief Complaint  Patient presents with  . Diarrhea    Metformin is giving her diarrhea and cramping in stomach  . Sinusitis    green/ bloody production    Diarrhea   This is a new problem. The current episode started more than 1 month ago. The problem has been gradually worsening. The stool consistency is described as blood tinged. Associated symptoms include abdominal pain. Pertinent negatives include no arthralgias, bloating, chills, coughing, fever, headaches, increased  flatus, myalgias, sweats, URI, vomiting or weight loss. Nothing aggravates the symptoms. There are no known risk factors. She has tried nothing for the symptoms. The treatment provided moderate relief. There is no history of bowel resection, inflammatory bowel disease, irritable bowel syndrome, malabsorption, a recent abdominal surgery or short gut syndrome. recently started metformen  Sinusitis  This is a chronic problem. The current episode started more than 1 year ago. The problem is unchanged. There has been no fever. The pain is moderate. Pertinent negatives include no chills, congestion, coughing, diaphoresis, ear pain, headaches, hoarse voice, neck pain, shortness of breath, sinus pressure, sneezing, sore throat or swollen glands. (Yellow/green/bloody discharge) Past treatments include nothing. The treatment provided no relief.    No problem-specific Assessment & Plan notes found for this encounter.   Past Medical History:  Diagnosis Date  . Allergy   . Anxiety   . Atrial fibrillation (HCC)   . CHF (congestive heart failure) (HCC)   . Depression   . GERD (gastroesophageal reflux disease)   . Hyperlipidemia   . Hypertension     Past Surgical History:  Procedure Laterality Date  . COLONOSCOPY  2006   had 2 polyps- benign    Family History  Problem Relation Age of Onset   . Colon cancer Father   . Heart disease Brother     Social History   Socioeconomic History  . Marital status: Widowed    Spouse name: Not on file  . Number of children: Not on file  . Years of education: Not on file  . Highest education level: Not on file  Occupational History  . Not on file  Social Needs  . Financial resource strain: Not on file  . Food insecurity:    Worry: Not on file    Inability: Not on file  . Transportation needs:    Medical: Not on file    Non-medical: Not on file  Tobacco Use  . Smoking status: Former Smoker    Last attempt to quit: 01/17/1983    Years since quitting: 34.4  . Smokeless tobacco: Former Engineer, water and Sexual Activity  . Alcohol use: No    Alcohol/week: 0.0 oz  . Drug use: No  . Sexual activity: Never  Lifestyle  . Physical activity:    Days per week: Not on file    Minutes per session: Not on file  . Stress: Not on file  Relationships  . Social connections:    Talks on phone: Not on file    Gets together: Not on file    Attends religious service: Not on file    Active member of club or organization: Not on file    Attends meetings of clubs or organizations: Not on file    Relationship status: Not on file  . Intimate partner violence:    Fear  of current or ex partner: Not on file    Emotionally abused: Not on file    Physically abused: Not on file    Forced sexual activity: Not on file  Other Topics Concern  . Not on file  Social History Narrative  . Not on file    No Known Allergies  Outpatient Medications Prior to Visit  Medication Sig Dispense Refill  . ALPRAZolam (XANAX) 0.25 MG tablet Take 0.25 mg by mouth 2 (two) times daily. [facility]    . diltiazem (CARDIZEM CD) 120 MG 24 hr capsule Take 1 capsule (120 mg total) by mouth daily. 30 capsule 0  . metoprolol tartrate (LOPRESSOR) 100 MG tablet Take 1 tablet (100 mg total) by mouth 2 (two) times daily. 180 tablet 1  . Multiple Vitamins-Minerals (MULTIVITAL PO)  Take 1 tablet by mouth daily.    . Omega-3 Fatty Acids (FISH OIL) 1200 MG CAPS Take 1 capsule (1,200 mg total) by mouth daily. 30 capsule 11  . ONETOUCH VERIO test strip USE 1 STRIP TO CHECK GLUCOSE ONCE DAILY 50 each 5  . rivaroxaban (XARELTO) 20 MG TABS tablet Take 1 tablet by mouth daily. Dr Allena Katz    . simvastatin (ZOCOR) 40 MG tablet Take 1 tablet (40 mg total) by mouth daily. 90 tablet 1  . valsartan (DIOVAN) 320 MG tablet Take 1 tablet (320 mg total) by mouth daily. 90 tablet 0  . metFORMIN (GLUCOPHAGE) 500 MG tablet Take 1 tablet (500 mg total) by mouth 2 (two) times daily with a meal. 60 tablet 5   No facility-administered medications prior to visit.     Review of Systems  Constitutional: Negative for chills, diaphoresis, fever, malaise/fatigue and weight loss.  HENT: Negative for congestion, ear discharge, ear pain, hoarse voice, sinus pressure, sneezing and sore throat.   Eyes: Negative for blurred vision.  Respiratory: Negative for cough, sputum production, shortness of breath and wheezing.   Cardiovascular: Negative for chest pain, palpitations and leg swelling.  Gastrointestinal: Positive for abdominal pain and diarrhea. Negative for bloating, blood in stool, constipation, flatus, heartburn, melena, nausea and vomiting.  Genitourinary: Negative for dysuria, frequency, hematuria and urgency.  Musculoskeletal: Negative for arthralgias, back pain, joint pain, myalgias and neck pain.  Skin: Negative for rash.  Neurological: Negative for dizziness, tingling, sensory change, focal weakness and headaches.  Endo/Heme/Allergies: Negative for environmental allergies and polydipsia. Does not bruise/bleed easily.  Psychiatric/Behavioral: Negative for depression and suicidal ideas. The patient is not nervous/anxious and does not have insomnia.      Objective  Vitals:   07/04/17 1143  BP: 120/74  Pulse: 72  Weight: 184 lb (83.5 kg)  Height: 5\' 5"  (1.651 m)    Physical Exam   Constitutional: She is oriented to person, place, and time. She appears well-developed and well-nourished.  HENT:  Head: Normocephalic.  Right Ear: Tympanic membrane, external ear and ear canal normal.  Left Ear: Tympanic membrane, external ear and ear canal normal.  Nose: Right sinus exhibits no maxillary sinus tenderness and no frontal sinus tenderness. Left sinus exhibits no maxillary sinus tenderness and no frontal sinus tenderness.  Mouth/Throat: Uvula is midline, oropharynx is clear and moist and mucous membranes are normal. No oropharyngeal exudate, posterior oropharyngeal edema or posterior oropharyngeal erythema.  Eyes: Pupils are equal, round, and reactive to light. Conjunctivae and EOM are normal. Lids are everted and swept, no foreign bodies found. Left eye exhibits no hordeolum. No foreign body present in the left eye. Right conjunctiva is not  injected. Left conjunctiva is not injected. No scleral icterus.  Neck: Normal range of motion. Neck supple. No JVD present. No tracheal deviation present. No thyromegaly present.  Cardiovascular: Normal rate, regular rhythm, normal heart sounds and intact distal pulses. Exam reveals no gallop and no friction rub.  No murmur heard. Pulmonary/Chest: Effort normal and breath sounds normal. No respiratory distress. She has no wheezes. She has no rales.  Abdominal: Soft. Bowel sounds are normal. She exhibits no mass. There is no hepatosplenomegaly. There is no tenderness. There is no rebound and no guarding.  Musculoskeletal: Normal range of motion. She exhibits no edema or tenderness.  Lymphadenopathy:    She has no cervical adenopathy.  Neurological: She is alert and oriented to person, place, and time. She has normal strength. She displays normal reflexes. No cranial nerve deficit.  Skin: Skin is warm. No rash noted.  Psychiatric: She has a normal mood and affect. Her mood appears not anxious. She does not exhibit a depressed mood.  Nursing note  and vitals reviewed.     Assessment & Plan  Problem List Items Addressed This Visit    None    Visit Diagnoses    Diarrhea, unspecified type    -  Primary   Likely secondary to metformen. Will stop will recheck in 1 week.   Acute maxillary sinusitis, recurrence not specified       Recent onset with yellow/green / bloody mucus. Will start azithromycin 250 mg over 5 days   Relevant Medications   azithromycin (ZITHROMAX) 250 MG tablet      Meds ordered this encounter  Medications  . azithromycin (ZITHROMAX) 250 MG tablet    Sig: 2 today then 1 a day for 4 days.    Dispense:  6 tablet    Refill:  0      Dr. Hayden [facility]  07/04/17

## 2017-07-10 ENCOUNTER — Encounter: Payer: Self-pay | Admitting: Family Medicine

## 2017-07-10 ENCOUNTER — Ambulatory Visit: Payer: Medicare Other | Admitting: Family Medicine

## 2017-07-10 VITALS — BP 120/80 | HR 64 | Ht 65.0 in | Wt 185.0 lb

## 2017-07-10 DIAGNOSIS — E119 Type 2 diabetes mellitus without complications: Secondary | ICD-10-CM

## 2017-07-10 NOTE — Progress Notes (Signed)
Name: Kristina Wagner   MRN: [id number]    DOB: [birth year]   Date:07/10/2017       Progress Note  Subjective  Chief Complaint  Chief Complaint  Patient presents with  . Diabetes    had to stop metformin due to diarrhea- avg 165. "stomach quit bothering me after stopping the medicine"    Diabetes  She presents for her follow-up diabetic visit. She has type 2 diabetes mellitus. Her disease course has been fluctuating. Pertinent negatives for hypoglycemia include no confusion, dizziness, headaches, hunger, mood changes, nervousness/anxiousness, pallor, seizures, sleepiness, speech difficulty, sweats or tremors. Pertinent negatives for diabetes include no blurred vision, no chest pain, no fatigue, no foot paresthesias, no foot ulcerations, no polydipsia, no polyphagia, no polyuria, no visual change, no weakness and no weight loss. There are no hypoglycemic complications. Symptoms are stable. There are no diabetic complications. There are no known risk factors for coronary artery disease. Her weight is increasing steadily. She is following a generally healthy diet. She rarely participates in exercise. Her breakfast blood glucose is taken between 8-9 am. Her breakfast blood glucose range is generally 140-180 mg/dl.    No problem-specific Assessment & Plan notes found for this encounter.   Past Medical History:  Diagnosis Date  . Allergy   . Anxiety   . Atrial fibrillation (HCC)   . CHF (congestive heart failure) (HCC)   . Depression   . GERD (gastroesophageal reflux disease)   . Hyperlipidemia   . Hypertension     Past Surgical History:  Procedure Laterality Date  . COLONOSCOPY  2006   had 2 polyps- benign    Family History  Problem Relation Age of Onset  . Colon cancer Father   . Heart disease Brother     Social History   Socioeconomic History  . Marital status: Widowed    Spouse name: Not on file  . Number of children: Not on file  . Years of education: Not on file   . Highest education level: Not on file  Occupational History  . Not on file  Social Needs  . Financial resource strain: Not on file  . Food insecurity:    Worry: Not on file    Inability: Not on file  . Transportation needs:    Medical: Not on file    Non-medical: Not on file  Tobacco Use  . Smoking status: Former Smoker    Last attempt to quit: 01/17/1983    Years since quitting: 34.5  . Smokeless tobacco: Former Engineer, water and Sexual Activity  . Alcohol use: No    Alcohol/week: 0.0 oz  . Drug use: No  . Sexual activity: Never  Lifestyle  . Physical activity:    Days per week: Not on file    Minutes per session: Not on file  . Stress: Not on file  Relationships  . Social connections:    Talks on phone: Not on file    Gets together: Not on file    Attends religious service: Not on file    Active member of club or organization: Not on file    Attends meetings of clubs or organizations: Not on file    Relationship status: Not on file  . Intimate partner violence:    Fear of current or ex partner: Not on file    Emotionally abused: Not on file    Physically abused: Not on file    Forced sexual activity: Not on file  Other Topics Concern  .  Not on file  Social History Narrative  . Not on file    No Known Allergies  Outpatient Medications Prior to Visit  Medication Sig Dispense Refill  . ALPRAZolam (XANAX) 0.25 MG tablet Take 0.25 mg by mouth 2 (two) times daily. [facility]    . diltiazem (CARDIZEM CD) 120 MG 24 hr capsule Take 1 capsule (120 mg total) by mouth daily. 30 capsule 0  . metoprolol tartrate (LOPRESSOR) 100 MG tablet Take 1 tablet (100 mg total) by mouth 2 (two) times daily. 180 tablet 1  . Multiple Vitamins-Minerals (MULTIVITAL PO) Take 1 tablet by mouth daily.    . Omega-3 Fatty Acids (FISH OIL) 1200 MG CAPS Take 1 capsule (1,200 mg total) by mouth daily. 30 capsule 11  . ONETOUCH VERIO test strip USE 1 STRIP TO CHECK GLUCOSE ONCE DAILY 50 each 5  .  rivaroxaban (XARELTO) 20 MG TABS tablet Take 1 tablet by mouth daily. Dr Allena KatzPatel    . simvastatin (ZOCOR) 40 MG tablet Take 1 tablet (40 mg total) by mouth daily. 90 tablet 1  . valsartan (DIOVAN) 320 MG tablet Take 1 tablet (320 mg total) by mouth daily. 90 tablet 0  . azithromycin (ZITHROMAX) 250 MG tablet 2 today then 1 a day for 4 days. 6 tablet 0   No facility-administered medications prior to visit.     Review of Systems  Constitutional: Negative for chills, fatigue, fever, malaise/fatigue and weight loss.  HENT: Negative for ear discharge, ear pain and sore throat.   Eyes: Negative for blurred vision.  Respiratory: Negative for cough, sputum production, shortness of breath and wheezing.   Cardiovascular: Negative for chest pain, palpitations and leg swelling.  Gastrointestinal: Negative for abdominal pain, blood in stool, constipation, diarrhea, heartburn, melena and nausea.  Genitourinary: Negative for dysuria, frequency, hematuria and urgency.  Musculoskeletal: Negative for back pain, joint pain, myalgias and neck pain.  Skin: Negative for pallor and rash.  Neurological: Negative for dizziness, tingling, tremors, sensory change, focal weakness, seizures, speech difficulty, weakness and headaches.  Endo/Heme/Allergies: Negative for environmental allergies, polydipsia and polyphagia. Does not bruise/bleed easily.  Psychiatric/Behavioral: Negative for confusion, depression and suicidal ideas. The patient is not nervous/anxious and does not have insomnia.      Objective  Vitals:   07/10/17 1402  BP: 120/80  Pulse: 64  Weight: 185 lb (83.9 kg)  Height: 5\' 5"  (1.651 m)    Physical Exam  Constitutional: No distress.  HENT:  Head: Normocephalic and atraumatic.  Right Ear: External ear normal.  Left Ear: External ear normal.  Nose: Nose normal.  Mouth/Throat: Oropharynx is clear and moist.  Eyes: Pupils are equal, round, and reactive to light. Conjunctivae and EOM are normal.  Right eye exhibits no discharge. Left eye exhibits no discharge.  Neck: Normal range of motion. Neck supple. No JVD present. No thyromegaly present.  Cardiovascular: Normal rate, regular rhythm, normal heart sounds and intact distal pulses. Exam reveals no gallop and no friction rub.  No murmur heard. Pulmonary/Chest: Effort normal and breath sounds normal. She has no wheezes. She has no rales.  Abdominal: Soft. Bowel sounds are normal. She exhibits no mass. There is no tenderness. There is no guarding.  Musculoskeletal: Normal range of motion. She exhibits no edema.  Lymphadenopathy:    She has no cervical adenopathy.  Neurological: She is alert. She has normal reflexes.  Skin: Skin is warm and dry. She is not diaphoretic.      Assessment & Plan  Problem List  Items Addressed This Visit    None    Visit Diagnoses    Type 2 diabetes mellitus without complication, without long-term current use of insulin (HCC)    -  Primary   Patient not fully convinced. Her "sugar bell" continues. and she cannot tolerate metformin. Diet reemphasized and will recheck 6 weeks.next step a1c/poss referr      No orders of the defined types were placed in this encounter.     Dr. [facility]den [facility]  07/10/17

## 2017-07-10 NOTE — Patient Instructions (Signed)
Diabetes Mellitus and Nutrition When you have diabetes (diabetes mellitus), it is very important to have healthy eating habits because your blood sugar (glucose) levels are greatly affected by what you eat and drink. Eating healthy foods in the appropriate amounts, at about the same times every day, can help you:  Control your blood glucose.  Lower your risk of heart disease.  Improve your blood pressure.  Reach or maintain a healthy weight.  Every person with diabetes is different, and each person has different needs for a meal plan. Your health care provider may recommend that you work with a diet and nutrition specialist (dietitian) to make a meal plan that is best for you. Your meal plan may vary depending on factors such as:  The calories you need.  The medicines you take.  Your weight.  Your blood glucose, blood pressure, and cholesterol levels.  Your activity level.  Other health conditions you have, such as heart or kidney disease.  How do carbohydrates affect me? Carbohydrates affect your blood glucose level more than any other type of food. Eating carbohydrates naturally increases the amount of glucose in your blood. Carbohydrate counting is a method for keeping track of how many carbohydrates you eat. Counting carbohydrates is important to keep your blood glucose at a healthy level, especially if you use insulin or take certain oral diabetes medicines. It is important to know how many carbohydrates you can safely have in each meal. This is different for every person. Your dietitian can help you calculate how many carbohydrates you should have at each meal and for snack. Foods that contain carbohydrates include:  Bread, cereal, rice, pasta, and crackers.  Potatoes and corn.  Peas, beans, and lentils.  Milk and yogurt.  Fruit and juice.  Desserts, such as cakes, cookies, ice cream, and candy.  How does alcohol affect me? Alcohol can cause a sudden decrease in blood  glucose (hypoglycemia), especially if you use insulin or take certain oral diabetes medicines. Hypoglycemia can be a life-threatening condition. Symptoms of hypoglycemia (sleepiness, dizziness, and confusion) are similar to symptoms of having too much alcohol. If your health care provider says that alcohol is safe for you, follow these guidelines:  Limit alcohol intake to no more than 1 drink per day for nonpregnant women and 2 drinks per day for men. One drink equals 12 oz of beer, 5 oz of wine, or 1 oz of hard liquor.  Do not drink on an empty stomach.  Keep yourself hydrated with water, diet soda, or unsweetened iced tea.  Keep in mind that regular soda, juice, and other mixers may contain a lot of sugar and must be counted as carbohydrates.  What are tips for following this plan? Reading food labels  Start by checking the serving size on the label. The amount of calories, carbohydrates, fats, and other nutrients listed on the label are based on one serving of the food. Many foods contain more than one serving per package.  Check the total grams (g) of carbohydrates in one serving. You can calculate the number of servings of carbohydrates in one serving by dividing the total carbohydrates by 15. For example, if a food has 30 g of total carbohydrates, it would be equal to 2 servings of carbohydrates.  Check the number of grams (g) of saturated and trans fats in one serving. Choose foods that have low or no amount of these fats.  Check the number of milligrams (mg) of sodium in one serving. Most people   should limit total sodium intake to less than 2,300 mg per day.  Always check the nutrition information of foods labeled as "low-fat" or "nonfat". These foods may be higher in added sugar or refined carbohydrates and should be avoided.  Talk to your dietitian to identify your daily goals for nutrients listed on the label. Shopping  Avoid buying canned, premade, or processed foods. These  foods tend to be high in fat, sodium, and added sugar.  Shop around the outside edge of the grocery store. This includes fresh fruits and vegetables, bulk grains, fresh meats, and fresh dairy. Cooking  Use low-heat cooking methods, such as baking, instead of high-heat cooking methods like deep frying.  Cook using healthy oils, such as olive, canola, or sunflower oil.  Avoid cooking with butter, cream, or high-fat meats. Meal planning  Eat meals and snacks regularly, preferably at the same times every day. Avoid going long periods of time without eating.  Eat foods high in fiber, such as fresh fruits, vegetables, beans, and whole grains. Talk to your dietitian about how many servings of carbohydrates you can eat at each meal.  Eat 4-6 ounces of lean protein each day, such as lean meat, chicken, fish, eggs, or tofu. 1 ounce is equal to 1 ounce of meat, chicken, or fish, 1 egg, or 1/4 cup of tofu.  Eat some foods each day that contain healthy fats, such as avocado, nuts, seeds, and fish. Lifestyle   Check your blood glucose regularly.  Exercise at least 30 minutes 5 or more days each week, or as told by your health care provider.  Take medicines as told by your health care provider.  Do not use any products that contain nicotine or tobacco, such as cigarettes and e-cigarettes. If you need help quitting, ask your health care provider.  Work with a counselor or diabetes educator to identify strategies to manage stress and any emotional and social challenges. What are some questions to ask my health care provider?  Do I need to meet with a diabetes educator?  Do I need to meet with a dietitian?  What number can I call if I have questions?  When are the best times to check my blood glucose? Where to find more information:  American Diabetes Association: diabetes.org/food-and-fitness/food  Academy of Nutrition and Dietetics:  www.eatright.org/resources/health/diseases-and-conditions/diabetes  National Institute of Diabetes and Digestive and Kidney Diseases (NIH): www.niddk.nih.gov/health-information/diabetes/overview/diet-eating-physical-activity Summary  A healthy meal plan will help you control your blood glucose and maintain a healthy lifestyle.  Working with a diet and nutrition specialist (dietitian) can help you make a meal plan that is best for you.  Keep in mind that carbohydrates and alcohol have immediate effects on your blood glucose levels. It is important to count carbohydrates and to use alcohol carefully. This information is not intended to replace advice given to you by your health care provider. Make sure you discuss any questions you have with your health care provider. Document Released: 09/29/2004 Document Revised: 02/07/2016 Document Reviewed: 02/07/2016 Elsevier Interactive Patient Education  2018 [employer]  

## 2017-08-08 ENCOUNTER — Other Ambulatory Visit: Payer: Self-pay

## 2017-08-08 ENCOUNTER — Other Ambulatory Visit: Payer: Self-pay | Admitting: Family Medicine

## 2017-08-08 MED ORDER — VALSARTAN 320 MG PO TABS: 320.0000 mg | ORAL_TABLET | Freq: Every day | ORAL | 0 refills | Status: DC

## 2017-08-13 ENCOUNTER — Encounter: Payer: Self-pay | Admitting: *Deleted

## 2017-08-13 ENCOUNTER — Other Ambulatory Visit: Payer: Self-pay

## 2017-08-14 NOTE — Anesthesia Preprocedure Evaluation (Addendum)
Anesthesia Evaluation  Patient identified by MRN, date of birth, ID band Patient awake    Reviewed: Allergy & Precautions, NPO status , Patient's Chart, lab work & pertinent test results  Airway Mallampati: II  TM Distance: >3 FB     Dental   Pulmonary former smoker,    breath sounds clear to auscultation       Cardiovascular hypertension, (-) angina+ dysrhythmias (atrial fibrillation, paroxysmal. Takes xarelto. )  Rhythm:Regular Rate:Normal  Nuclear stress test 03/15/17: normal. EF > 60%   Neuro/Psych Anxiety Depression    GI/Hepatic GERD  Medicated,  Endo/Other  diabetes, Type 2  Renal/GU      Musculoskeletal  (+) Arthritis ,   Abdominal   Peds  Hematology   Anesthesia Other Findings   Reproductive/Obstetrics                          Anesthesia Physical Anesthesia Plan  ASA: III  Anesthesia Plan: MAC   Post-op Pain Management:    Induction: Intravenous  PONV Risk Score and Plan:   Airway Management Planned: Nasal Cannula  Additional Equipment:   Intra-op Plan:   Post-operative Plan:   Informed Consent: I have reviewed the patients History and Physical, chart, labs and discussed the procedure including the risks, benefits and alternatives for the proposed anesthesia with the patient or authorized representative who has indicated his/her understanding and acceptance.     Plan Discussed with: CRNA  Anesthesia Plan Comments:         Anesthesia Quick Evaluation

## 2017-08-15 NOTE — Discharge Instructions (Signed)

## 2017-08-20 ENCOUNTER — Telehealth: Payer: Self-pay | Admitting: Family Medicine

## 2017-08-21 ENCOUNTER — Ambulatory Visit: Payer: Medicare Other | Admitting: Family Medicine

## 2017-08-21 ENCOUNTER — Encounter: Payer: Self-pay | Admitting: Family Medicine

## 2017-08-21 VITALS — BP 120/80 | HR 60 | Ht 65.0 in | Wt 188.0 lb

## 2017-08-21 DIAGNOSIS — E119 Type 2 diabetes mellitus without complications: Secondary | ICD-10-CM | POA: Diagnosis not present

## 2017-08-21 MED ORDER — GLIPIZIDE 5 MG PO TABS: 5.0000 mg | ORAL_TABLET | Freq: Every day | ORAL | 1 refills | Status: DC

## 2017-08-21 NOTE — Progress Notes (Signed)
Name: Kristina PennerBarbara Jean Madia   MRN: [id number]    DOB: 23-Aug-1934   Date:08/21/2017       Progress Note  Subjective  Chief Complaint  Chief Complaint  Patient presents with  . Diabetes    was prescribed as BID- pt dropped to once a day. Needs recheck on A1C    Diabetes  She presents for her follow-up diabetic visit. She has type 2 diabetes mellitus. Her disease course has been fluctuating. There are no hypoglycemic associated symptoms. Pertinent negatives for hypoglycemia include no dizziness, headaches or nervousness/anxiousness. Pertinent negatives for diabetes include no blurred vision, no chest pain, no fatigue, no foot paresthesias, no foot ulcerations, no polydipsia, no polyphagia, no polyuria, no visual change, no weakness and no weight loss. There are no hypoglycemic complications. There are no diabetic complications. Risk factors for coronary artery disease include diabetes mellitus, dyslipidemia and hypertension. Current diabetic treatment includes oral agent (monotherapy). She is compliant with treatment most of the time. She is following a generally unhealthy diet. When asked about meal planning, she reported none. She participates in exercise daily. Home blood sugar record trend: only take medicine if glucose elevated. Her breakfast blood glucose is taken between 8-9 am. An ACE inhibitor/angiotensin II receptor blocker is being taken.    No problem-specific Assessment & Plan notes found for this encounter.   Past Medical History:  Diagnosis Date  . Allergy   . Anxiety   . Arthritis    ankles, knees  . Atrial fibrillation (HCC)   . CHF (congestive heart failure) (HCC)    pt denies  . Depression   . DM (diabetes mellitus), type 2 (HCC)   . GERD (gastroesophageal reflux disease)   . HOH (hard of hearing)   . Hyperlipidemia   . Hypertension   . Thyroid nodule   . Wears dentures    Partial lower, full upper    Past Surgical History:  Procedure Laterality Date  .  COLONOSCOPY  2006   had 2 polyps- benign    Family History  Problem Relation Age of Onset  . Colon cancer Father   . Heart disease Brother     Social History   Socioeconomic History  . Marital status: Widowed    Spouse name: Not on file  . Number of children: Not on file  . Years of education: Not on file  . Highest education level: Not on file  Occupational History  . Not on file  Social Needs  . Financial resource strain: Not on file  . Food insecurity:    Worry: Not on file    Inability: Not on file  . Transportation needs:    Medical: Not on file    Non-medical: Not on file  Tobacco Use  . Smoking status: Former Smoker    Last attempt to quit: 01/17/1983    Years since quitting: 34.6  . Smokeless tobacco: Never Used  Substance and Sexual Activity  . Alcohol use: No    Alcohol/week: 0.0 oz  . Drug use: No  . Sexual activity: Never  Lifestyle  . Physical activity:    Days per week: Not on file    Minutes per session: Not on file  . Stress: Not on file  Relationships  . Social connections:    Talks on phone: Not on file    Gets together: Not on file    Attends religious service: Not on file    Active member of club or organization: Not on file  Attends meetings of clubs or organizations: Not on file    Relationship status: Not on file  . Intimate partner violence:    Fear of current or ex partner: Not on file    Emotionally abused: Not on file    Physically abused: Not on file    Forced sexual activity: Not on file  Other Topics Concern  . Not on file  Social History Narrative  . Not on file    No Known Allergies  Outpatient Medications Prior to Visit  Medication Sig Dispense Refill  . ALPRAZolam (XANAX) 0.25 MG tablet Take 0.25 mg by mouth 2 (two) times daily. [facility]    . Apoaequorin (PREVAGEN PO) Take by mouth daily.    . B Complex Vitamins (VITAMIN B COMPLEX PO) Take by mouth daily.    . Cyanocobalamin (VITAMIN B12 PO) Take by mouth daily.    Marland Kitchen  diltiazem (CARDIZEM CD) 120 MG 24 hr capsule Take 1 capsule (120 mg total) by mouth daily. 30 capsule 0  . lansoprazole (PREVACID) 15 MG capsule Take 15 mg by mouth daily as needed.    . metoprolol tartrate (LOPRESSOR) 100 MG tablet Take 1 tablet (100 mg total) by mouth 2 (two) times daily. 180 tablet 1  . Multiple Vitamins-Minerals (MULTIVITAL PO) Take 1 tablet by mouth daily.    . Omega-3 Fatty Acids (FISH OIL) 1200 MG CAPS Take 1 capsule (1,200 mg total) by mouth daily. 30 capsule 11  . ONETOUCH VERIO test strip USE 1 STRIP TO CHECK GLUCOSE ONCE DAILY 50 each 5  . rivaroxaban (XARELTO) 20 MG TABS tablet Take 1 tablet by mouth daily. Dr Allena Katz    . simvastatin (ZOCOR) 40 MG tablet Take 1 tablet (40 mg total) by mouth daily. 90 tablet 1  . valsartan (DIOVAN) 320 MG tablet Take 1 tablet (320 mg total) by mouth daily. 90 tablet 0  . metFORMIN (GLUCOPHAGE) 500 MG tablet Take 500 mg by mouth daily.      No facility-administered medications prior to visit.     Review of Systems  Constitutional: Negative for chills, fatigue, fever, malaise/fatigue and weight loss.  HENT: Negative for ear discharge, ear pain and sore throat.   Eyes: Negative for blurred vision.  Respiratory: Negative for cough, sputum production, shortness of breath and wheezing.   Cardiovascular: Negative for chest pain, palpitations and leg swelling.  Gastrointestinal: Negative for abdominal pain, blood in stool, constipation, diarrhea, heartburn, melena and nausea.  Genitourinary: Negative for dysuria, frequency, hematuria and urgency.  Musculoskeletal: Negative for back pain, joint pain, myalgias and neck pain.  Skin: Negative for rash.  Neurological: Negative for dizziness, tingling, sensory change, focal weakness, weakness and headaches.  Endo/Heme/Allergies: Negative for environmental allergies, polydipsia and polyphagia. Does not bruise/bleed easily.  Psychiatric/Behavioral: Negative for depression and suicidal ideas. The  patient is not nervous/anxious and does not have insomnia.      Objective  Vitals:   08/21/17 1328  BP: 120/80  Pulse: 60  Weight: 188 lb (85.3 kg)  Height: 5\' 5"  (1.651 m)    Physical Exam  Constitutional: She is oriented to person, place, and time. She appears well-developed and well-nourished. No distress.  HENT:  Head: Normocephalic and atraumatic.  Right Ear: External ear normal.  Left Ear: External ear normal.  Nose: Nose normal.  Mouth/Throat: Oropharynx is clear and moist.  Eyes: Pupils are equal, round, and reactive to light. Conjunctivae and EOM are normal. Lids are everted and swept, no foreign bodies found. Right eye  exhibits no discharge. Left eye exhibits no discharge and no hordeolum. No foreign body present in the left eye. Right conjunctiva is not injected. Left conjunctiva is not injected. No scleral icterus.  Neck: Normal range of motion. Neck supple. No JVD present. No tracheal deviation present. No thyromegaly present.  Cardiovascular: Normal rate, regular rhythm, normal heart sounds and intact distal pulses. Exam reveals no gallop and no friction rub.  No murmur heard. Pulmonary/Chest: Effort normal and breath sounds normal. No respiratory distress. She has no wheezes. She has no rales.  Abdominal: Soft. Bowel sounds are normal. She exhibits no mass. There is no hepatosplenomegaly. There is no tenderness. There is no rebound and no guarding.  Musculoskeletal: Normal range of motion. She exhibits no edema or tenderness.  Lymphadenopathy:    She has no cervical adenopathy.  Neurological: She is alert and oriented to person, place, and time. She has normal strength and normal reflexes. She displays normal reflexes. No cranial nerve deficit.  Skin: Skin is warm and dry. No rash noted. She is not diaphoretic.  Psychiatric: She has a normal mood and affect. Her mood appears not anxious. She does not exhibit a depressed mood.  Nursing note and vitals  reviewed.     Assessment & Plan  Problem List Items Addressed This Visit    None    Visit Diagnoses    Diabetes mellitus without complication (HCC)    -  Primary   Patient not fully grasping the diabetes.Will stop metformen. Will initiate glipizide 2.5 mg daily. will refer to diabetic dietary counseling. will check a1c.   Relevant Medications   glipiZIDE (GLUCOTROL) 5 MG tablet   Other Relevant Orders   Hemoglobin A1c   Ambulatory referral to diabetic education      Meds ordered this encounter  Medications  . glipiZIDE (GLUCOTROL) 5 MG tablet    Sig: Take 1 tablet (5 mg total) by mouth daily before breakfast.    Dispense:  30 tablet    Refill:  1      Dr. Hayden [facility]  08/21/17

## 2017-08-22 LAB — HEMOGLOBIN A1C
Est. average glucose Bld gHb Est-mCnc: 183 mg/dL
Hgb A1c MFr Bld: 8 % — ABNORMAL HIGH (ref 4.8–5.6)

## 2017-09-05 ENCOUNTER — Encounter: Payer: Self-pay | Admitting: *Deleted

## 2017-09-05 ENCOUNTER — Other Ambulatory Visit: Payer: Self-pay

## 2017-09-12 ENCOUNTER — Encounter
Admission: RE | Disposition: A | Discharge status: Home / Self Care | Payer: Self-pay | Source: Ambulatory Visit | Attending: Ophthalmology

## 2017-09-12 ENCOUNTER — Ambulatory Visit: Payer: Medicare Other | Admitting: Anesthesiology

## 2017-09-12 ENCOUNTER — Ambulatory Visit
Admission: RE | Admit: 2017-09-12 | Discharge: 2017-09-12 | Disposition: A | Discharge status: Home / Self Care | Payer: Medicare Other | Source: Ambulatory Visit | Attending: Ophthalmology | Admitting: Ophthalmology

## 2017-09-12 DIAGNOSIS — Z7901 Long term (current) use of anticoagulants: Secondary | ICD-10-CM | POA: Insufficient documentation

## 2017-09-12 DIAGNOSIS — I10 Essential (primary) hypertension: Secondary | ICD-10-CM | POA: Diagnosis not present

## 2017-09-12 DIAGNOSIS — H2512 Age-related nuclear cataract, left eye: Secondary | ICD-10-CM | POA: Diagnosis not present

## 2017-09-12 DIAGNOSIS — E1136 Type 2 diabetes mellitus with diabetic cataract: Secondary | ICD-10-CM | POA: Diagnosis not present

## 2017-09-12 DIAGNOSIS — Z87891 Personal history of nicotine dependence: Secondary | ICD-10-CM | POA: Insufficient documentation

## 2017-09-12 DIAGNOSIS — K219 Gastro-esophageal reflux disease without esophagitis: Secondary | ICD-10-CM | POA: Insufficient documentation

## 2017-09-12 DIAGNOSIS — I480 Paroxysmal atrial fibrillation: Secondary | ICD-10-CM | POA: Insufficient documentation

## 2017-09-12 HISTORY — DX: Type 2 diabetes mellitus without complications: E11.9

## 2017-09-12 HISTORY — DX: Unspecified hearing loss, unspecified ear: H91.90

## 2017-09-12 HISTORY — PX: CATARACT EXTRACTION W/PHACO: SHX586

## 2017-09-12 HISTORY — DX: Presence of dental prosthetic device (complete) (partial): Z97.2

## 2017-09-12 HISTORY — DX: Nontoxic single thyroid nodule: E04.1

## 2017-09-12 HISTORY — DX: Unspecified osteoarthritis, unspecified site: M19.90

## 2017-09-12 LAB — GLUCOSE, CAPILLARY
Glucose-Capillary: 227 mg/dL — ABNORMAL HIGH (ref 70–99)
Glucose-Capillary: 164 mg/dL — ABNORMAL HIGH (ref 70–99)

## 2017-09-12 SURGERY — PHACOEMULSIFICATION, CATARACT, WITH IOL INSERTION
Anesthesia: Monitor Anesthesia Care | Site: Eye | Laterality: Left | Wound class: "Clean "

## 2017-09-12 MED ORDER — BRIMONIDINE TARTRATE-TIMOLOL 0.2-0.5 % OP SOLN
OPHTHALMIC | Status: DC | PRN
  Administered 2017-09-12: 1 [drp] via OPHTHALMIC

## 2017-09-12 MED ORDER — FENTANYL CITRATE (PF) 100 MCG/2ML IJ SOLN
INTRAMUSCULAR | Status: DC | PRN
  Administered 2017-09-12: 50 ug via INTRAVENOUS

## 2017-09-12 MED ORDER — LACTATED RINGERS IV SOLN: 10.0000 mL/h | INTRAVENOUS | Status: DC

## 2017-09-12 MED ORDER — ARMC OPHTHALMIC DILATING DROPS
1.0000 "application " | OPHTHALMIC | Status: DC | PRN
  Administered 2017-09-12 (×3): 1 via OPHTHALMIC

## 2017-09-12 MED ORDER — CEFUROXIME OPHTHALMIC INJECTION 1 MG/0.1 ML
INJECTION | OPHTHALMIC | Status: DC | PRN
  Administered 2017-09-12: 0.1 mL via INTRACAMERAL

## 2017-09-12 MED ORDER — NA HYALUR & NA CHOND-NA HYALUR 0.4-0.35 ML IO KIT
PACK | INTRAOCULAR | Status: DC | PRN
  Administered 2017-09-12: 1 mL via INTRAOCULAR

## 2017-09-12 MED ORDER — MOXIFLOXACIN HCL 0.5 % OP SOLN
1.0000 [drp] | OPHTHALMIC | Status: DC | PRN
  Administered 2017-09-12 (×3): 1 [drp] via OPHTHALMIC

## 2017-09-12 MED ORDER — LIDOCAINE HCL (PF) 2 % IJ SOLN
INTRAOCULAR | Status: DC | PRN
  Administered 2017-09-12: 1 mL via INTRAMUSCULAR

## 2017-09-12 MED ORDER — MIDAZOLAM HCL 2 MG/2ML IJ SOLN
INTRAMUSCULAR | Status: DC | PRN
  Administered 2017-09-12 (×2): 1 mg via INTRAVENOUS

## 2017-09-12 MED ORDER — ONDANSETRON HCL 4 MG/2ML IJ SOLN: 4.0000 mg | Freq: Once | INTRAMUSCULAR | Status: DC | PRN

## 2017-09-12 MED ORDER — EPINEPHRINE PF 1 MG/ML IJ SOLN
INTRAOCULAR | Status: DC | PRN
  Administered 2017-09-12: 64 mL via OPHTHALMIC

## 2017-09-12 SURGICAL SUPPLY — 20 items
CANNULA ANT/CHMB 27G (MISCELLANEOUS) ×1 IMPLANT
CANNULA ANT/CHMB 27GA (MISCELLANEOUS) ×2 IMPLANT
GLOVE SURG LX 7.5 STRW (GLOVE) ×1
GLOVE SURG LX STRL 7.5 STRW (GLOVE) ×1 IMPLANT
GLOVE SURG TRIUMPH 8.0 PF LTX (GLOVE) ×2 IMPLANT
GOWN STRL REUS W/ TWL LRG LVL3 (GOWN DISPOSABLE) ×2 IMPLANT
GOWN STRL REUS W/TWL LRG LVL3 (GOWN DISPOSABLE) ×2
LENS IOL TECNIS ITEC 22.0 (Intraocular Lens) ×1 IMPLANT
MARKER SKIN DUAL TIP RULER LAB (MISCELLANEOUS) ×2 IMPLANT
NDL FILTER BLUNT 18X1 1/2 (NEEDLE) ×1 IMPLANT
NEEDLE FILTER BLUNT 18X 1/2SAF (NEEDLE) ×1
NEEDLE FILTER BLUNT 18X1 1/2 (NEEDLE) ×1 IMPLANT
PACK CATARACT BRASINGTON (MISCELLANEOUS) ×2 IMPLANT
PACK EYE AFTER SURG (MISCELLANEOUS) ×2 IMPLANT
PACK OPTHALMIC (MISCELLANEOUS) ×2 IMPLANT
SYR 3ML LL SCALE MARK (SYRINGE) ×2 IMPLANT
SYR 5ML LL (SYRINGE) ×2 IMPLANT
SYR TB 1ML LUER SLIP (SYRINGE) ×2 IMPLANT
WATER STERILE IRR 500ML POUR (IV SOLUTION) ×2 IMPLANT
WIPE NON LINTING 3.25X3.25 (MISCELLANEOUS) ×2 IMPLANT

## 2017-09-12 NOTE — H&P (Signed)
The History and Physical notes are on paper, have been signed, and are to be scanned. The patient remains stable and unchanged from the H&P.   Previous H&P reviewed, patient examined, and there are no changes.  Kristina Wagner 09/12/2017 7:27 AM   

## 2017-09-12 NOTE — Op Note (Signed)
OPERATIVE NOTE  Orlando PennerBarbara Jean Asante [id number] 09/12/2017   PREOPERATIVE DIAGNOSIS:  Nuclear sclerotic cataract left eye. H25.12   POSTOPERATIVE DIAGNOSIS:    Nuclear sclerotic cataract left eye.     PROCEDURE:  Phacoemusification with posterior chamber intraocular lens placement of the left eye   LENS:   Implant Name Type Inv. Item Serial No. Manufacturer Lot No. LRB No. Used  LENS IOL DIOP 22.0 - [id number] Intraocular Lens LENS IOL DIOP 22.0 [id number]  Left 1        ULTRASOUND TIME: 14  % of 1 minutes 9 seconds, CDE 9.9  SURGEON:  Deirdre Evenerhadwick R. Kamariyah Timberlake, MD   ANESTHESIA:  Topical with tetracaine drops and 2% Xylocaine jelly, augmented with 1% preservative-free intracameral lidocaine.    COMPLICATIONS:  None.   DESCRIPTION OF PROCEDURE:  The patient was identified in the holding room and transported to the operating room and placed in the supine position under the operating microscope.  The left eye was identified as the operative eye and it was prepped and draped in the usual sterile ophthalmic fashion.   A 1 millimeter clear-corneal paracentesis was made at the 1:30 position.  0.5 ml of preservative-free 1% lidocaine was injected into the anterior chamber.  The anterior chamber was filled with Viscoat viscoelastic.  A 2.4 millimeter keratome was used to make a near-clear corneal incision at the 10:30 position.  .  A curvilinear capsulorrhexis was made with a cystotome and capsulorrhexis forceps.  Balanced salt solution was used to hydrodissect and hydrodelineate the nucleus.   Phacoemulsification was then used in stop and chop fashion to remove the lens nucleus and epinucleus.  The remaining cortex was then removed using the irrigation and aspiration handpiece. Provisc was then placed into the capsular bag to distend it for lens placement.  A lens was then injected into the capsular bag.  The remaining viscoelastic was aspirated.   Wounds were hydrated with balanced salt  solution.  The anterior chamber was inflated to a physiologic pressure with balanced salt solution.  No wound leaks were noted. Cefuroxime 0.1 ml of a 10mg /ml solution was injected into the anterior chamber for a dose of 1 mg of intracameral antibiotic at the completion of the case.   Timolol and Brimonidine drops were applied to the eye.  The patient was taken to the recovery room in stable condition without complications of anesthesia or surgery.  Syed Zukas 09/12/2017, 7:49 AM

## 2017-09-12 NOTE — Anesthesia Postprocedure Evaluation (Signed)
Anesthesia Post Note  Patient: Kristina Wagner  Procedure(s) Performed: CATARACT EXTRACTION PHACO AND INTRAOCULAR LENS PLACEMENT (IOC) LEFT DIABETES (Left Eye)  Patient location during evaluation: PACU Anesthesia Type: MAC Level of consciousness: awake and alert Pain management: pain level controlled Vital Signs Assessment: post-procedure vital signs reviewed and stable Respiratory status: spontaneous breathing, nonlabored ventilation, respiratory function stable and patient connected to nasal cannula oxygen Cardiovascular status: stable and blood pressure returned to baseline Postop Assessment: no apparent nausea or vomiting Anesthetic complications: no    Veda Canning

## 2017-09-12 NOTE — Anesthesia Procedure Notes (Signed)
Procedure Name: MAC Date/Time: 09/12/2017 7:30 AM Performed by: Janna Arch, CRNA Pre-anesthesia Checklist: Patient identified, Emergency Drugs available, Suction available and Patient being monitored Patient Re-evaluated:Patient Re-evaluated prior to induction Oxygen Delivery Method: Nasal cannula

## 2017-09-12 NOTE — Transfer of Care (Signed)
Immediate Anesthesia Transfer of Care Note  Patient: Kristina Wagner  Procedure(s) Performed: CATARACT EXTRACTION PHACO AND INTRAOCULAR LENS PLACEMENT (IOC) LEFT DIABETES (Left Eye)  Patient Location: PACU  Anesthesia Type: MAC  Level of Consciousness: awake, alert  and patient cooperative  Airway and Oxygen Therapy: Patient Spontanous Breathing and Patient connected to supplemental oxygen  Post-op Assessment: Post-op Vital signs reviewed, Patient's Cardiovascular Status Stable, Respiratory Function Stable, Patent Airway and No signs of Nausea or vomiting  Post-op Vital Signs: Reviewed and stable  Complications: No apparent anesthesia complications

## 2017-09-13 ENCOUNTER — Encounter: Payer: Self-pay | Admitting: Ophthalmology

## 2017-09-21 ENCOUNTER — Encounter: Payer: Self-pay | Admitting: *Deleted

## 2017-09-21 ENCOUNTER — Encounter: Payer: Medicare Other | Attending: Family Medicine | Admitting: *Deleted

## 2017-09-21 VITALS — BP 130/70 | Ht 64.0 in | Wt 191.1 lb

## 2017-09-21 DIAGNOSIS — E119 Type 2 diabetes mellitus without complications: Secondary | ICD-10-CM | POA: Insufficient documentation

## 2017-09-21 DIAGNOSIS — Z713 Dietary counseling and surveillance: Secondary | ICD-10-CM | POA: Diagnosis not present

## 2017-09-21 DIAGNOSIS — E1165 Type 2 diabetes mellitus with hyperglycemia: Secondary | ICD-10-CM

## 2017-09-21 NOTE — Progress Notes (Signed)
Diabetes Self-Management Education  Visit Type: First/Initial  Appt. Start Time: 1330 Appt. End Time: 1445  09/21/2017  [name], identified by name and date of birth, is a 83 y.o. female with a diagnosis of Diabetes: Type 2.   ASSESSMENT  Blood pressure 130/70, height 5\' 4"  (1.626 m), weight 191 lb 1.6 oz (86.7 kg). Body mass index is 32.8 kg/m.  Diabetes Self-Management Education - 09/21/17 1547      Visit Information   Visit Type  First/Initial      Initial Visit   Diabetes Type  Type 2    Are you currently following a meal plan?  No    Are you taking your medications as prescribed?  Yes   not currently taking vitamins due to recent cataract surgery   Date Diagnosed  3 months      Health Coping   How would you rate your overall health?  Fair      Psychosocial Assessment   Patient Belief/Attitude about Diabetes  Motivated to manage diabetes    Self-care barriers  None    Self-management support  Doctor's office;Friends    Patient Concerns  Nutrition/Meal planning;Glycemic Control;Monitoring    Special Needs  None    Preferred Learning Style  Auditory    Learning Readiness  Ready    How often do you need to have someone help you when you read instructions, pamphlets, or other written materials from your doctor or pharmacy?  1 - Never    What is the last grade level you completed in school?  9th      Pre-Education Assessment   Patient understands the diabetes disease and treatment process.  Needs Instruction    Patient understands incorporating nutritional management into lifestyle.  Needs Instruction    Patient undertands incorporating physical activity into lifestyle.  Needs Instruction    Patient understands using medications safely.  Needs Instruction    Patient understands monitoring blood glucose, interpreting and using results  Needs Review    Patient understands prevention, detection, and treatment of acute complications.  Needs Instruction    Patient  understands prevention, detection, and treatment of chronic complications.  Needs Instruction    Patient understands how to develop strategies to address psychosocial issues.  Needs Instruction    Patient understands how to develop strategies to promote health/change behavior.  Needs Instruction      Complications   Last HgB A1C per patient/outside source  8 %   08/21/17   How often do you check your blood sugar?  1-2 times/day   Pt reports FBG's range from 170-190 mg/dL.    Fasting Blood glucose range (mg/dL)  754-492;010-071    Have you had a dilated eye exam in the past 12 months?  Yes    Have you had a dental exam in the past 12 months?  No   dentures   Are you checking your feet?  No      Dietary Intake   Breakfast  eats 2-3 meals/day - eggs, grits, toast, biscuit    Lunch  sometimes skips - fruit - apple, banana, peaches, peanut butter crackers, candy, left overs    Dinner  doesn't always eat supper - chicken, beef, pork, fish with potatoes, corn, pinto beans, okra, peas, tomatoes, pasta, non-starchy veggies    Beverage(s)  water, fruit juice, coffee      Exercise   Exercise Type  ADL's      Patient Education   Previous Diabetes Education  No  Disease state   Definition of diabetes, type 1 and 2, and the diagnosis of diabetes;Factors that contribute to the development of diabetes    Nutrition management   Role of diet in the treatment of diabetes and the relationship between the three main macronutrients and blood glucose level;Reviewed blood glucose goals for pre and post meals and how to evaluate the patients' food intake on their blood glucose level.;Meal timing in regards to the patients' current diabetes medication.    Physical activity and exercise   Role of exercise on diabetes management, blood pressure control and cardiac health.    Medications  Reviewed patients medication for diabetes, action, purpose, timing of dose and side effects.    Monitoring  Purpose and  frequency of SMBG.;Taught/discussed recording of test results and interpretation of SMBG.;Identified appropriate SMBG and/or A1C goals.    Acute complications  Taught treatment of hypoglycemia - the 15 rule.    Chronic complications  Relationship between chronic complications and blood glucose control    Psychosocial adjustment  Role of stress on diabetes;Identified and addressed patients feelings and concerns about diabetes      Individualized Goals (developed by patient)   Reducing Risk  Improve blood sugars Prevent diabetes complications Become more fit     Outcomes   Expected Outcomes  Demonstrated interest in learning. Expect positive outcomes       Individualized Plan for Diabetes Self-Management Training:   Learning Objective:  Patient will have a greater understanding of diabetes self-management. Patient education plan is to attend individual and/or group sessions per assessed needs and concerns.   Plan:   Patient Instructions  Check blood sugars 1 x day before breakfast or 2 hrs after one meal every day Bring blood sugar records to the next class Exercise:  Walk as tolerated Eat 3 meals day,  1-2  snacks a day Space meals 4-6 hours apart Don't skip meals Limit desserts/sweets Avoid sugar sweetened drinks (juices)  Expected Outcomes:  Demonstrated interest in learning. Expect positive outcomes  Education material provided:  General Meal Planning Guidelines Simple Meal Plan Symptoms, causes and treatments of Hypoglycemia  If problems or questions, patient to contact team via:  Sharion Settler, RN, CCM, CDE [phone]  Future DSME appointment:  October 11, 2017 for Diabetes Class 1

## 2017-09-21 NOTE — Patient Instructions (Signed)
Check blood sugars 2 x day before breakfast or 2 hrs after one meal every day Bring blood sugar records to the next class  Exercise:  Walk as tolerated  Eat 3 meals day,  1-2  snacks a day Space meals 4-6 hours apart Don't skip meals Limit desserts/sweets Avoid sugar sweetened drinks (juices)  Return for classes on:

## 2017-10-09 ENCOUNTER — Other Ambulatory Visit: Payer: Self-pay

## 2017-10-09 ENCOUNTER — Encounter: Payer: Self-pay | Admitting: *Deleted

## 2017-10-10 ENCOUNTER — Encounter: Payer: Self-pay | Admitting: Anesthesiology

## 2017-10-11 ENCOUNTER — Encounter: Payer: Medicare Other | Admitting: Dietician

## 2017-10-11 ENCOUNTER — Encounter: Payer: Self-pay | Admitting: Dietician

## 2017-10-11 VITALS — Wt 188.5 lb

## 2017-10-11 DIAGNOSIS — E1165 Type 2 diabetes mellitus with hyperglycemia: Secondary | ICD-10-CM

## 2017-10-11 DIAGNOSIS — Z713 Dietary counseling and surveillance: Secondary | ICD-10-CM | POA: Diagnosis not present

## 2017-10-11 NOTE — Progress Notes (Signed)

## 2017-10-15 NOTE — Discharge Instructions (Signed)

## 2017-10-16 NOTE — Anesthesia Preprocedure Evaluation (Deleted)
Anesthesia Evaluation    Airway        Dental   Pulmonary former smoker (quit 1985),           Cardiovascular hypertension, + dysrhythmias (paroxysmal a fib on Xarelto)   ECG 10/10/17: Sinus rhythm with short PR Otherwise normal ECG  Stress test 03/15/17: Normal myocardial perfusion scan no evidence of stress-induced myocardial ischemia ejection fraction of 64% conclusion negative scan. Low risk scan   Neuro/Psych PSYCHIATRIC DISORDERS Anxiety Depression    GI/Hepatic GERD  ,  Endo/Other  diabetes  Renal/GU      Musculoskeletal  (+) Arthritis , Osteoarthritis,    Abdominal   Peds  Hematology   Anesthesia Other Findings   Reproductive/Obstetrics                             Anesthesia Physical Anesthesia Plan  ASA: III  Anesthesia Plan: MAC   Post-op Pain Management:    Induction: Intravenous  PONV Risk Score and Plan: 2 and TIVA and Midazolam  Airway Management Planned: Natural Airway  Additional Equipment:   Intra-op Plan:   Post-operative Plan:   Informed Consent:   Plan Discussed with:   Anesthesia Plan Comments: (Patient in atrial fibrillation with HR 128 in 12-lead done in preop.  Case cancelled and recommended patient call cardiologist for evaluation.)       Anesthesia Quick Evaluation

## 2017-10-17 ENCOUNTER — Encounter
Admission: RE | Disposition: A | Discharge status: Home / Self Care | Payer: Self-pay | Source: Ambulatory Visit | Attending: Ophthalmology

## 2017-10-17 ENCOUNTER — Ambulatory Visit
Admission: RE | Admit: 2017-10-17 | Discharge: 2017-10-17 | Disposition: A | Discharge status: Home / Self Care | Payer: Medicare Other | Source: Ambulatory Visit | Attending: Ophthalmology | Admitting: Ophthalmology

## 2017-10-17 DIAGNOSIS — Z539 Procedure and treatment not carried out, unspecified reason: Secondary | ICD-10-CM | POA: Insufficient documentation

## 2017-10-17 DIAGNOSIS — I4892 Unspecified atrial flutter: Secondary | ICD-10-CM | POA: Diagnosis not present

## 2017-10-17 DIAGNOSIS — H269 Unspecified cataract: Secondary | ICD-10-CM | POA: Diagnosis present

## 2017-10-17 SURGERY — PHACOEMULSIFICATION, CATARACT, WITH IOL INSERTION
Anesthesia: Monitor Anesthesia Care | Laterality: Right

## 2017-10-17 MED ORDER — MOXIFLOXACIN HCL 0.5 % OP SOLN: 1.0000 [drp] | OPHTHALMIC | Status: DC | PRN

## 2017-10-17 MED ORDER — TETRACAINE HCL 0.5 % OP SOLN: 1.0000 [drp] | OPHTHALMIC | Status: DC | PRN

## 2017-10-17 MED ORDER — LACTATED RINGERS IV SOLN: INTRAVENOUS | Status: DC

## 2017-10-17 MED ORDER — ARMC OPHTHALMIC DILATING DROPS: 1.0000 "application " | OPHTHALMIC | Status: DC | PRN

## 2017-10-17 SURGICAL SUPPLY — 24 items

## 2017-10-17 NOTE — H&P (Signed)
Surgery canceled

## 2017-10-23 ENCOUNTER — Telehealth: Payer: Self-pay | Admitting: *Deleted

## 2017-10-23 ENCOUNTER — Other Ambulatory Visit: Payer: Self-pay | Admitting: Family Medicine

## 2017-10-23 DIAGNOSIS — I10 Essential (primary) hypertension: Secondary | ICD-10-CM

## 2017-10-23 NOTE — Telephone Encounter (Signed)
Phone call from patient. She could not remember the dates for her next  2 Diabetes classes. She was scheduled for evening classes next week but reports that she wants to come to the morning classes. She already came to Class 1 on Sept 26. Rescheduled her last 2 classes and she will return on Oct 24 and Oct 31.

## 2017-10-29 ENCOUNTER — Ambulatory Visit: Payer: Medicare Other

## 2017-11-05 ENCOUNTER — Ambulatory Visit: Payer: Medicare Other

## 2017-11-08 ENCOUNTER — Encounter: Payer: Medicare Other | Attending: Family Medicine | Admitting: *Deleted

## 2017-11-08 ENCOUNTER — Encounter: Payer: Self-pay | Admitting: *Deleted

## 2017-11-08 VITALS — Wt 189.7 lb

## 2017-11-08 DIAGNOSIS — E119 Type 2 diabetes mellitus without complications: Secondary | ICD-10-CM | POA: Insufficient documentation

## 2017-11-08 DIAGNOSIS — E1165 Type 2 diabetes mellitus with hyperglycemia: Secondary | ICD-10-CM

## 2017-11-08 DIAGNOSIS — Z713 Dietary counseling and surveillance: Secondary | ICD-10-CM | POA: Insufficient documentation

## 2017-11-08 NOTE — Progress Notes (Signed)

## 2017-11-15 ENCOUNTER — Ambulatory Visit: Payer: Medicare Other

## 2017-12-06 ENCOUNTER — Encounter: Payer: Medicare Other | Attending: Family Medicine | Admitting: Dietician

## 2017-12-06 ENCOUNTER — Encounter: Payer: Self-pay | Admitting: Dietician

## 2017-12-06 VITALS — BP 134/82 | Wt 192.6 lb

## 2017-12-06 DIAGNOSIS — Z713 Dietary counseling and surveillance: Secondary | ICD-10-CM | POA: Insufficient documentation

## 2017-12-06 DIAGNOSIS — E119 Type 2 diabetes mellitus without complications: Secondary | ICD-10-CM | POA: Insufficient documentation

## 2017-12-06 DIAGNOSIS — E1165 Type 2 diabetes mellitus with hyperglycemia: Secondary | ICD-10-CM

## 2017-12-06 NOTE — Progress Notes (Signed)
Appt. Start Time: 900 Appt. End Time: 1200  Class 3 Diabetes Overview - identify functions of pancreas and insulin; define insulin deficiency vs insulin resistance  Medications - state name, dose, timing of currently prescribed medications; describe types of medications available for diabetes  Psychosocial - identify DM as a source of stress; state the effects of stress on BG control; verbalize appropriate stress management techniques; identify personal stress issues   Nutritional Management - use food labels to identify serving size, content of carbohydrate, fiber, protein, fat, saturated fat and sodium; recognize food sources of fat, saturated fat, trans fat, and sodium, and verbalize goals for intake; describe healthful, appropriate food choices when dining out   Exercise - state a plan for personal exercise; verbalize contraindications for exercise  Self-Monitoring - state importance of SMBG; use SMBG results to effectively manage diabetes; identify importance of regular HbA1C testing and goals for results  Acute Complications - recognize hyperglycemia and hypoglycemia with causes and effects; identify blood glucose results as high, low or in control; list steps in treating and preventing high and low blood glucose  Chronic Complications - state importance of daily self-foot exams; explain appropriate eye and dental care  Lifestyle Changes/Goals & Health/Community Resources - set goals for proper diabetes care; state need for and frequency of healthcare follow-up; describe appropriate community resources for good health (ADA, web sites, apps)   Teaching Materials Used: Class 3 Slide Packet Diabetes Stress Test Stress Management Tools Stress Poem Goal Setting Worksheet Website/App List    

## 2017-12-07 ENCOUNTER — Encounter: Payer: Self-pay | Admitting: *Deleted

## 2017-12-11 ENCOUNTER — Other Ambulatory Visit: Payer: Self-pay | Admitting: Family Medicine

## 2017-12-11 DIAGNOSIS — E782 Mixed hyperlipidemia: Secondary | ICD-10-CM

## 2017-12-12 DIAGNOSIS — M1611 Unilateral primary osteoarthritis, right hip: Secondary | ICD-10-CM | POA: Insufficient documentation

## 2018-01-01 ENCOUNTER — Other Ambulatory Visit: Payer: Self-pay | Admitting: Family Medicine

## 2018-01-01 ENCOUNTER — Other Ambulatory Visit: Payer: Self-pay

## 2018-01-01 MED ORDER — GLIPIZIDE 5 MG PO TABS: 5.0000 mg | ORAL_TABLET | Freq: Every day | ORAL | 0 refills | Status: DC

## 2018-01-29 ENCOUNTER — Encounter: Payer: Self-pay | Admitting: Family Medicine

## 2018-01-29 ENCOUNTER — Ambulatory Visit (INDEPENDENT_AMBULATORY_CARE_PROVIDER_SITE_OTHER): Payer: Medicare Other | Admitting: Family Medicine

## 2018-01-29 VITALS — BP 110/72 | HR 72 | Ht 65.0 in | Wt 188.0 lb

## 2018-01-29 DIAGNOSIS — K219 Gastro-esophageal reflux disease without esophagitis: Secondary | ICD-10-CM

## 2018-01-29 DIAGNOSIS — F3341 Major depressive disorder, recurrent, in partial remission: Secondary | ICD-10-CM

## 2018-01-29 DIAGNOSIS — E782 Mixed hyperlipidemia: Secondary | ICD-10-CM | POA: Diagnosis not present

## 2018-01-29 DIAGNOSIS — E119 Type 2 diabetes mellitus without complications: Secondary | ICD-10-CM

## 2018-01-29 DIAGNOSIS — R69 Illness, unspecified: Secondary | ICD-10-CM

## 2018-01-29 DIAGNOSIS — I10 Essential (primary) hypertension: Secondary | ICD-10-CM | POA: Diagnosis not present

## 2018-01-29 DIAGNOSIS — E010 Iodine-deficiency related diffuse (endemic) goiter: Secondary | ICD-10-CM

## 2018-01-29 MED ORDER — GLIPIZIDE 5 MG PO TABS: 5.0000 mg | ORAL_TABLET | Freq: Every day | ORAL | 1 refills | Status: DC

## 2018-01-29 MED ORDER — FUROSEMIDE 20 MG PO TABS: 20.0000 mg | ORAL_TABLET | Freq: Every day | ORAL | 1 refills | Status: DC | PRN

## 2018-01-29 MED ORDER — VALSARTAN 320 MG PO TABS: 320.0000 mg | ORAL_TABLET | Freq: Every day | ORAL | 1 refills | Status: DC

## 2018-01-29 MED ORDER — DILTIAZEM HCL ER COATED BEADS 120 MG PO CP24: 120.0000 mg | ORAL_CAPSULE | Freq: Every day | ORAL | 1 refills | Status: DC

## 2018-01-29 MED ORDER — VENLAFAXINE HCL ER 150 MG PO CP24: 150.0000 mg | ORAL_CAPSULE | Freq: Every day | ORAL | 1 refills | Status: DC

## 2018-01-29 MED ORDER — LANSOPRAZOLE 15 MG PO CPDR: 15.0000 mg | DELAYED_RELEASE_CAPSULE | Freq: Every day | ORAL | 1 refills | Status: DC | PRN

## 2018-01-29 MED ORDER — FISH OIL 1200 MG PO CAPS: 1.0000 | ORAL_CAPSULE | Freq: Every day | ORAL | 11 refills | Status: AC

## 2018-01-29 MED ORDER — HYDRALAZINE HCL 25 MG PO TABS: 25.0000 mg | ORAL_TABLET | Freq: Two times a day (BID) | ORAL | 1 refills | Status: DC

## 2018-01-29 MED ORDER — SIMVASTATIN 40 MG PO TABS: 40.0000 mg | ORAL_TABLET | Freq: Every day | ORAL | 1 refills | Status: DC

## 2018-01-29 MED ORDER — METOPROLOL TARTRATE 100 MG PO TABS: 100.0000 mg | ORAL_TABLET | Freq: Two times a day (BID) | ORAL | 1 refills | Status: DC

## 2018-01-29 NOTE — Addendum Note (Signed)
Addended by: Duanne Limerick on: 01/29/2018 01:42 PM   Modules accepted: Level of Service

## 2018-01-29 NOTE — Progress Notes (Signed)
Date:  01/29/2018   Name:  Kristina Wagner   DOB:  25-Jun-1934   MRN:  [id number]   Chief Complaint: Depression; Hyperlipidemia; Hypertension; and Diabetes  Depression         This is a chronic problem.  The onset quality is gradual.   The problem occurs intermittently.  The problem has been gradually improving since onset.  Associated symptoms include no decreased concentration, no fatigue, no helplessness, no hopelessness, does not have insomnia, not irritable, no restlessness, no decreased interest, no appetite change, no body aches, no myalgias, no headaches, no indigestion, not sad and no suicidal ideas.     The symptoms are aggravated by family issues.  Past treatments include SNRIs - Serotonin and norepinephrine reuptake inhibitors.  Compliance with treatment is good.  Previous treatment provided moderate relief.   Pertinent negatives include no hypothyroidism and no anxiety. Hyperlipidemia  This is a chronic problem. The current episode started more than 1 year ago. The problem is controlled. Recent lipid tests were reviewed and are normal. She has no history of chronic renal disease, diabetes, hypothyroidism, liver disease or obesity. Factors aggravating her hyperlipidemia include thiazides. Pertinent negatives include no chest pain, myalgias or shortness of breath. Current antihyperlipidemic treatment includes statins. The current treatment provides mild improvement of lipids. There are no compliance problems.  Risk factors for coronary artery disease include dyslipidemia, diabetes mellitus and hypertension.  Hypertension  This is a chronic problem. The current episode started more than 1 year ago. The problem has been waxing and waning since onset. The problem is controlled. Pertinent negatives include no anxiety, blurred vision, chest pain, headaches, malaise/fatigue, neck pain, orthopnea, palpitations, peripheral edema, PND, shortness of breath or sweats. There are no associated agents  to hypertension. Risk factors for coronary artery disease include diabetes mellitus and dyslipidemia. Past treatments include angiotensin blockers, calcium channel blockers and diuretics (hydalazine). The current treatment provides moderate improvement. There are no compliance problems.  There is no history of angina, kidney disease, CAD/MI, CVA, heart failure, left ventricular hypertrophy, PVD or retinopathy. There is no history of chronic renal disease or a hypertension causing med.  Diabetes  She presents for her follow-up diabetic visit. She has type 2 diabetes mellitus. Her disease course has been stable. There are no hypoglycemic associated symptoms. Pertinent negatives for hypoglycemia include no dizziness, headaches, nervousness/anxiousness or sweats. Pertinent negatives for diabetes include no blurred vision, no chest pain, no fatigue, no foot paresthesias, no foot ulcerations, no polydipsia, no polyphagia, no polyuria, no visual change, no weakness and no weight loss. There are no hypoglycemic complications. Symptoms are stable. There are no diabetic complications. Pertinent negatives for diabetic complications include no CVA, PVD or retinopathy. Risk factors for coronary artery disease include dyslipidemia, diabetes mellitus and hypertension. Current diabetic treatment includes oral agent (monotherapy). Her weight is increasing steadily. She is following a generally healthy diet. Her home blood glucose trend is fluctuating minimally. Her breakfast blood glucose is taken between 8-9 am. Her breakfast blood glucose range is generally >200 mg/dl. An ACE inhibitor/angiotensin II receptor blocker is being taken. Eye exam is current.    Review of Systems  Constitutional: Negative.  Negative for appetite change, chills, fatigue, fever, malaise/fatigue, unexpected weight change and weight loss.  HENT: Negative for congestion, ear discharge, ear pain, rhinorrhea, sinus pressure, sneezing and sore throat.     Eyes: Negative for blurred vision, photophobia, pain, discharge, redness and itching.  Respiratory: Negative for cough, shortness of breath, wheezing  and stridor.   Cardiovascular: Negative for chest pain, palpitations, orthopnea and PND.  Gastrointestinal: Negative for abdominal pain, blood in stool, constipation, diarrhea, nausea and vomiting.  Endocrine: Negative for cold intolerance, heat intolerance, polydipsia, polyphagia and polyuria.  Genitourinary: Negative for dysuria, flank pain, frequency, hematuria, menstrual problem, pelvic pain, urgency, vaginal bleeding and vaginal discharge.  Musculoskeletal: Negative for arthralgias, back pain, myalgias and neck pain.  Skin: Negative for rash.  Allergic/Immunologic: Negative for environmental allergies and food allergies.  Neurological: Negative for dizziness, weakness, light-headedness, numbness and headaches.  Hematological: Negative for adenopathy. Does not bruise/bleed easily.  Psychiatric/Behavioral: Positive for depression. Negative for decreased concentration, dysphoric mood and suicidal ideas. The patient is not nervous/anxious and does not have insomnia.     Patient Active Problem List   Diagnosis Date Noted  . Essential hypertension 10/19/2016  . Thyromegaly 12/22/2015  . Hyperlipidemia 10/13/2015    No Known Allergies  Past Surgical History:  Procedure Laterality Date  . CATARACT EXTRACTION W/PHACO Left 09/12/2017   Procedure: CATARACT EXTRACTION PHACO AND INTRAOCULAR LENS PLACEMENT (IOC) LEFT DIABETES;  Surgeon: Lockie MolaBrasington, Chadwick, MD;  [facility];  Service: Ophthalmology;  Laterality: Left;  Diabetic - oral meds  . COLONOSCOPY  2006   had 2 polyps- benign    Social History   Tobacco Use  . Smoking status: Former Smoker    Packs/day: 1.00    Years: 15.00    Pack years: 15.00    Types: Cigarettes    Last attempt to quit: 01/17/1983    Years since quitting: 35.0  . Smokeless tobacco: Never Used   Substance Use Topics  . Alcohol use: No    Alcohol/week: 0.0 standard drinks  . Drug use: No     Medication list has been reviewed and updated.  Current Meds  Medication Sig  . ALPRAZolam (XANAX) 0.25 MG tablet Take 0.25 mg by mouth 2 (two) times daily. [facility]  . Apoaequorin (PREVAGEN PO) Take 1 tablet by mouth daily.   . ARIPiprazole (ABILIFY) 2 MG tablet Take 1 mg by mouth daily.   . B Complex Vitamins (VITAMIN B COMPLEX PO) Take 1 tablet by mouth daily.   . Cyanocobalamin (VITAMIN B12 PO) Take 1 tablet by mouth daily.   . diclofenac sodium (VOLTAREN) 1 % GEL Apply topically.  Marland Kitchen. diltiazem (CARDIZEM CD) 120 MG 24 hr capsule Take 1 capsule (120 mg total) by mouth daily.  . furosemide (LASIX) 20 MG tablet Take 20 mg by mouth daily as needed.  Marland Kitchen. glipiZIDE (GLUCOTROL) 5 MG tablet Take 1 tablet (5 mg total) by mouth daily before breakfast.  . hydrALAZINE (APRESOLINE) 25 MG tablet Take 25 mg by mouth 2 (two) times daily.  . lansoprazole (PREVACID) 15 MG capsule Take 15 mg by mouth daily as needed.  . metoprolol tartrate (LOPRESSOR) 100 MG tablet TAKE 1 TABLET BY MOUTH TWICE DAILY  . Multiple Vitamins-Minerals (MULTIVITAL PO) Take 1 tablet by mouth daily.  . Omega-3 Fatty Acids (FISH OIL) 1200 MG CAPS Take 1 capsule (1,200 mg total) by mouth daily.  Letta Pate. ONETOUCH VERIO test strip USE 1 STRIP TO CHECK GLUCOSE ONCE DAILY  . rivaroxaban (XARELTO) 20 MG TABS tablet Take 1 tablet by mouth daily. Dr Allena KatzPatel  . simvastatin (ZOCOR) 40 MG tablet Take 1 tablet (40 mg total) by mouth daily.  . valsartan (DIOVAN) 320 MG tablet TAKE 1 TABLET BY MOUTH ONCE DAILY (Patient taking differently: Take 160 mg by mouth daily. )  . venlafaxine XR (EFFEXOR-XR)  150 MG 24 hr capsule Take 150 mg by mouth daily with breakfast.  . [DISCONTINUED] glipiZIDE (GLUCOTROL) 5 MG tablet TAKE 1 TABLET BY MOUTH DAILY BEFORE BREAKFAST    PHQ 2/9 Scores 01/29/2018 09/21/2017 05/11/2017 08/14/2016  PHQ - 2 Score 0 4 5 3   PHQ- 9 Score 0 8 8  7     Physical Exam Vitals signs and nursing note reviewed.  Constitutional:      General: She is not irritable.She is not in acute distress.    Appearance: She is not diaphoretic.  HENT:     Head: Normocephalic and atraumatic.     Right Ear: External ear normal.     Left Ear: External ear normal.     Nose: Nose normal.  Eyes:     General:        Right eye: No discharge.        Left eye: No discharge.     Conjunctiva/sclera: Conjunctivae normal.     Pupils: Pupils are equal, round, and reactive to light.  Neck:     Musculoskeletal: Normal range of motion and neck supple.     Thyroid: No thyromegaly.     Vascular: No JVD.  Cardiovascular:     Rate and Rhythm: Normal rate and regular rhythm.     Heart sounds: Normal heart sounds. No murmur. No friction rub. No gallop.   Pulmonary:     Effort: Pulmonary effort is normal.     Breath sounds: Normal breath sounds.  Abdominal:     General: Bowel sounds are normal.     Palpations: Abdomen is soft. There is no mass.     Tenderness: There is no abdominal tenderness. There is no guarding.  Musculoskeletal: Normal range of motion.  Feet:     Right foot:     Protective Sensation: 10 sites tested. 10 sites sensed.     Skin integrity: No ulcer, blister, skin breakdown, erythema, warmth, callus, dry skin or fissure.     Left foot:     Protective Sensation: 10 sites tested. 10 sites sensed.     Skin integrity: No ulcer, blister, skin breakdown, erythema, warmth, callus, dry skin or fissure.  Lymphadenopathy:     Cervical: No cervical adenopathy.  Skin:    General: Skin is warm and dry.  Neurological:     Mental Status: She is alert.     Deep Tendon Reflexes: Reflexes are normal and symmetric.     BP 110/72   Pulse 72   Ht 5\' 5"  (1.651 m)   Wt 188 lb (85.3 kg)   BMI 31.28 kg/m   Assessment and Plan: 1. Essential hypertension Chronic. Stable on meds. Had appointment with cardiologist to check blood pressure recently. Told to  continue medicines as prescribed. Refilled metoprolol, hydralazine, furosemide, diltiazem and valsartan./ Draw renal panel - metoprolol tartrate (LOPRESSOR) 100 MG tablet; Take 1 tablet (100 mg total) by mouth 2 (two) times daily.  Dispense: 180 tablet; Refill: 1 - hydrALAZINE (APRESOLINE) 25 MG tablet; Take 1 tablet (25 mg total) by mouth 2 (two) times daily.  Dispense: 180 tablet; Refill: 1 - furosemide (LASIX) 20 MG tablet; Take 1 tablet (20 mg total) by mouth daily as needed.  Dispense: 90 tablet; Refill: 1 - diltiazem (CARDIZEM CD) 120 MG 24 hr capsule; Take 1 capsule (120 mg total) by mouth daily.  Dispense: 90 capsule; Refill: 1 - valsartan (DIOVAN) 320 MG tablet; Take 1 tablet (320 mg total) by mouth daily.  Dispense: 90 tablet; Refill:  1 - Renal Function Panel  2. Mixed hyperlipidemia Chronic. Stable on meds- refill simvastatin/ draw lipid panel and continue Omega 3 otc. - simvastatin (ZOCOR) 40 MG tablet; Take 1 tablet (40 mg total) by mouth daily.  Dispense: 90 tablet; Refill: 1 - Omega-3 Fatty Acids (FISH OIL) 1200 MG CAPS; Take 1 capsule (1,200 mg total) by mouth daily.  Dispense: 30 capsule; Refill: 11 - Lipid panel  3. Recurrent major depressive disorder, in partial remission (HCC) Chronic. Stable on meds. Patient also sees psychiatry. PHQ9=0. Continue venlafaxine- refilled med. - venlafaxine XR (EFFEXOR-XR) 150 MG 24 hr capsule; Take 1 capsule (150 mg total) by mouth daily with breakfast.  Dispense: 90 capsule; Refill: 1  4. Diabetes mellitus without complication (HCC) Chronic. Patient is getting blood sugars in the 200s at home. Discussed pasta is high in sugars. Limit spaghetti intake. Refill glipizide, may need to increase to twice a day dosing. Patient cannot take metformin due to diarrhea. Draw A1C and renal panel. obtain micro. Will adjust accordingly.  - glipiZIDE (GLUCOTROL) 5 MG tablet; Take 1 tablet (5 mg total) by mouth daily before breakfast.  Dispense: 90 tablet;  Refill: 1 - Hemoglobin A1c - Renal Function Panel - Microalbumin / creatinine urine ratio  5. Gastroesophageal reflux disease, esophagitis presence not specified Chronic. Controlled on med- refill lansoprazole - lansoprazole (PREVACID) 15 MG capsule; Take 1 capsule (15 mg total) by mouth daily as needed.  Dispense: 90 capsule; Refill: 1  6. Thyromegaly Dr Genevive Bi biopsied nodule and it was benign. Draw TSH for monitoring. - TSH  7. Taking medication for chronic disease Draw hepatic for statin use - Hepatic Function Panel (6)

## 2018-01-30 LAB — HEMOGLOBIN A1C
Est. average glucose Bld gHb Est-mCnc: 194 mg/dL
Hgb A1c MFr Bld: 8.4 % — ABNORMAL HIGH (ref 4.8–5.6)

## 2018-01-30 LAB — MICROALBUMIN / CREATININE URINE RATIO
Creatinine, Urine: 167.8 mg/dL
Microalb/Creat Ratio: 16 mg/g creat (ref 0.0–30.0)
Microalbumin, Urine: 26.8 ug/mL

## 2018-01-30 LAB — TSH: TSH: 2.15 u[IU]/mL (ref 0.450–4.500)

## 2018-02-01 ENCOUNTER — Other Ambulatory Visit: Payer: Self-pay

## 2018-02-01 DIAGNOSIS — E119 Type 2 diabetes mellitus without complications: Secondary | ICD-10-CM

## 2018-02-01 MED ORDER — GLIPIZIDE 10 MG PO TABS: 10.0000 mg | ORAL_TABLET | Freq: Two times a day (BID) | ORAL | 0 refills | Status: DC

## 2018-02-19 NOTE — Telephone Encounter (Signed)
closing encounter

## 2018-02-25 ENCOUNTER — Other Ambulatory Visit: Payer: Self-pay

## 2018-02-25 DIAGNOSIS — E119 Type 2 diabetes mellitus without complications: Secondary | ICD-10-CM

## 2018-02-25 MED ORDER — GLIPIZIDE 10 MG PO TABS: 10.0000 mg | ORAL_TABLET | Freq: Two times a day (BID) | ORAL | 0 refills | Status: DC

## 2018-03-18 ENCOUNTER — Other Ambulatory Visit: Payer: Self-pay

## 2018-03-18 DIAGNOSIS — I10 Essential (primary) hypertension: Secondary | ICD-10-CM

## 2018-03-18 MED ORDER — DILTIAZEM HCL ER COATED BEADS 120 MG PO CP24: 120.0000 mg | ORAL_CAPSULE | Freq: Every day | ORAL | 0 refills | Status: DC

## 2018-05-16 ENCOUNTER — Other Ambulatory Visit: Payer: Self-pay

## 2018-05-17 ENCOUNTER — Other Ambulatory Visit: Payer: Self-pay

## 2018-05-17 ENCOUNTER — Encounter: Payer: Self-pay | Admitting: Family Medicine

## 2018-05-17 ENCOUNTER — Ambulatory Visit (INDEPENDENT_AMBULATORY_CARE_PROVIDER_SITE_OTHER): Payer: Medicare Other | Admitting: Family Medicine

## 2018-05-17 VITALS — BP 120/70 | HR 64 | Ht 65.0 in | Wt 188.0 lb

## 2018-05-17 DIAGNOSIS — F419 Anxiety disorder, unspecified: Secondary | ICD-10-CM | POA: Diagnosis not present

## 2018-05-17 DIAGNOSIS — E119 Type 2 diabetes mellitus without complications: Secondary | ICD-10-CM

## 2018-05-17 DIAGNOSIS — F321 Major depressive disorder, single episode, moderate: Secondary | ICD-10-CM

## 2018-05-17 NOTE — Progress Notes (Signed)
Date:  05/17/2018   Name:  Kristina HumphreysBarbara J Wagner   DOB:  [birth year]   MRN:  [id number]   Chief Complaint: Depression (pHQ9=9 has an upcoming appt with [facility] in May) and Diabetes (160-180)  Depression         This is a chronic problem.  The current episode started more than 1 year ago.   The onset quality is gradual.   The problem occurs intermittently.  The problem has been gradually worsening since onset.  Associated symptoms include helplessness, hopelessness and sad.  Associated symptoms include no decreased concentration, no fatigue, does not have insomnia, not irritable, no restlessness, no decreased interest, no appetite change, no body aches, no myalgias, no headaches, no indigestion and no suicidal ideas.     The symptoms are aggravated by medication.  Compliance with treatment is variable.  Past compliance problems include difficulty with treatment plan.  Previous treatment provided mild relief. Diabetes  She presents for her follow-up diabetic visit. She has type 2 diabetes mellitus. Her disease course has been fluctuating. Pertinent negatives for hypoglycemia include no confusion, dizziness, headaches, hunger, mood changes, nervousness/anxiousness, pallor, seizures, sleepiness, speech difficulty, sweats or tremors. Pertinent negatives for diabetes include no blurred vision, no chest pain, no fatigue, no foot paresthesias, no foot ulcerations, no polydipsia, no polyphagia, no polyuria, no visual change, no weakness and no weight loss. Symptoms are stable. There are no known risk factors for coronary artery disease. Current diabetic treatment includes oral agent (monotherapy). Her weight is decreasing steadily. She is following a generally unhealthy diet. Meal planning includes avoidance of concentrated sweets. Her home blood glucose trend is fluctuating minimally. Her breakfast blood glucose is taken between 8-9 am. Her breakfast blood glucose range is generally 140-180 mg/dl.    Review of Systems   Constitutional: Negative.  Negative for appetite change, chills, fatigue, fever, unexpected weight change and weight loss.  HENT: Negative for congestion, ear discharge, ear pain, rhinorrhea, sinus pressure, sneezing and sore throat.   Eyes: Negative for blurred vision, photophobia, pain, discharge, redness and itching.  Respiratory: Negative for cough, shortness of breath, wheezing and stridor.   Cardiovascular: Negative for chest pain.  Gastrointestinal: Negative for abdominal pain, blood in stool, constipation, diarrhea, nausea and vomiting.  Endocrine: Negative for cold intolerance, heat intolerance, polydipsia, polyphagia and polyuria.  Genitourinary: Negative for dysuria, flank pain, frequency, hematuria, menstrual problem, pelvic pain, urgency, vaginal bleeding and vaginal discharge.  Musculoskeletal: Negative for arthralgias, back pain and myalgias.  Skin: Negative for pallor and rash.  Allergic/Immunologic: Negative for environmental allergies and food allergies.  Neurological: Negative for dizziness, tremors, seizures, speech difficulty, weakness, light-headedness, numbness and headaches.  Hematological: Negative for adenopathy. Does not bruise/bleed easily.  Psychiatric/Behavioral: Positive for depression. Negative for confusion, decreased concentration, dysphoric mood and suicidal ideas. The patient is not nervous/anxious and does not have insomnia.     Patient Active Problem List   Diagnosis Date Noted  . Recurrent major depressive disorder, in partial remission (HCC) 01/29/2018  . Diabetes mellitus without complication (HCC) 01/29/2018  . Gastroesophageal reflux disease 01/29/2018  . Essential hypertension 10/19/2016  . Thyromegaly 12/22/2015  . Hyperlipidemia 10/13/2015    No Known Allergies  Past Surgical History:  Procedure Laterality Date  . CATARACT EXTRACTION W/PHACO Left 09/12/2017   Procedure: CATARACT EXTRACTION PHACO AND INTRAOCULAR LENS PLACEMENT (IOC) LEFT  DIABETES;  Surgeon: Lockie MolaBrasington, Chadwick, MD;  [facility];  Service: Ophthalmology;  Laterality: Left;  Diabetic - oral meds  .  COLONOSCOPY  2006   had 2 polyps- benign    Social History   Tobacco Use  . Smoking status: Former Smoker    Packs/day: 1.00    Years: 15.00    Pack years: 15.00    Types: Cigarettes    Last attempt to quit: 01/17/1983    Years since quitting: 35.3  . Smokeless tobacco: Never Used  Substance Use Topics  . Alcohol use: No    Alcohol/week: 0.0 standard drinks  . Drug use: No     Medication list has been reviewed and updated.  Current Meds  Medication Sig  . ALPRAZolam (XANAX) 0.25 MG tablet Take 0.25 mg by mouth 2 (two) times daily. [facility]  . Apoaequorin (PREVAGEN PO) Take 1 tablet by mouth daily.   . ARIPiprazole (ABILIFY) 2 MG tablet Take 1 mg by mouth daily.   . B Complex Vitamins (VITAMIN B COMPLEX PO) Take 1 tablet by mouth daily.   . Cyanocobalamin (VITAMIN B12 PO) Take 1 tablet by mouth daily.   . diclofenac sodium (VOLTAREN) 1 % GEL Apply topically.  Marland Kitchen diltiazem (CARDIZEM CD) 120 MG 24 hr capsule Take 1 capsule (120 mg total) by mouth daily.  . furosemide (LASIX) 20 MG tablet Take 1 tablet (20 mg total) by mouth daily as needed.  Marland Kitchen glipiZIDE (GLUCOTROL) 10 MG tablet Take 1 tablet (10 mg total) by mouth 2 (two) times daily before a meal.  . hydrALAZINE (APRESOLINE) 25 MG tablet Take 1 tablet (25 mg total) by mouth 2 (two) times daily.  . lansoprazole (PREVACID) 15 MG capsule Take 1 capsule (15 mg total) by mouth daily as needed.  . metoprolol tartrate (LOPRESSOR) 100 MG tablet Take 1 tablet (100 mg total) by mouth 2 (two) times daily.  . Multiple Vitamins-Minerals (MULTIVITAL PO) Take 1 tablet by mouth daily.  . Omega-3 Fatty Acids (FISH OIL) 1200 MG CAPS Take 1 capsule (1,200 mg total) by mouth daily.  Letta Pate VERIO test strip USE 1 STRIP TO CHECK GLUCOSE ONCE DAILY  . rivaroxaban (XARELTO) 20 MG TABS tablet Take 1 tablet by  mouth daily. Dr Allena Katz  . simvastatin (ZOCOR) 40 MG tablet Take 1 tablet (40 mg total) by mouth daily.  . valsartan (DIOVAN) 320 MG tablet Take 1 tablet (320 mg total) by mouth daily.  Marland Kitchen venlafaxine XR (EFFEXOR-XR) 150 MG 24 hr capsule Take 1 capsule (150 mg total) by mouth daily with breakfast.    PHQ 2/9 Scores 05/17/2018 01/29/2018 09/21/2017 05/11/2017  PHQ - 2 Score 5 0 4 5  PHQ- 9 Score 9 0 8 8    BP Readings from Last 3 Encounters:  05/17/18 120/70  01/29/18 110/72  12/06/17 134/82    Physical Exam Vitals signs and nursing note reviewed.  Constitutional:      General: She is not irritable.She is not in acute distress.    Appearance: She is normal weight. She is not diaphoretic.  HENT:     Head: Normocephalic and atraumatic.     Right Ear: Tympanic membrane, ear canal and external ear normal.     Left Ear: Tympanic membrane, ear canal and external ear normal.     Nose: Nose normal.  Eyes:     General:        Right eye: No discharge.        Left eye: No discharge.     Conjunctiva/sclera: Conjunctivae normal.     Pupils: Pupils are equal, round, and reactive to light.  Neck:  Musculoskeletal: Normal range of motion and neck supple. No neck rigidity or muscular tenderness.     Thyroid: No thyromegaly.     Vascular: No carotid bruit or JVD.  Cardiovascular:     Rate and Rhythm: Normal rate and regular rhythm.     Heart sounds: Normal heart sounds. No murmur. No friction rub. No gallop.   Pulmonary:     Effort: Pulmonary effort is normal.     Breath sounds: Normal breath sounds. No wheezing or rhonchi.  Abdominal:     General: Bowel sounds are normal.     Palpations: Abdomen is soft. There is no mass.     Tenderness: There is no abdominal tenderness. There is no guarding.  Musculoskeletal: Normal range of motion.  Lymphadenopathy:     Cervical: No cervical adenopathy.  Skin:    General: Skin is warm and dry.  Neurological:     Mental Status: She is alert.     Deep  Tendon Reflexes: Reflexes are normal and symmetric.     Wt Readings from Last 3 Encounters:  05/17/18 188 lb (85.3 kg)  01/29/18 188 lb (85.3 kg)  12/06/17 192 lb 9.6 oz (87.4 kg)    BP 120/70   Pulse 64   Ht 5\' 5"  (1.651 m)   Wt 188 lb (85.3 kg)   BMI 31.28 kg/m   Assessment and Plan: 1. Diabetes mellitus without complication (HCC) Chronic.  Controlled.  Continue A1c check with renal function panel as well as a microalbuminuria. - Hemoglobin A1c - Renal Function Panel - Microalbumin / creatinine urine ratio  2. Current moderate episode of major depressive disorder, unspecified whether recurrent [facility]) Patient continues to have depression that is exacerbated by recent loss of a pet and sister grieving the loss of her husband.  3. Anxiety Patient has continued anxiety which is exacerbated by her depression.

## 2018-05-17 NOTE — Patient Instructions (Signed)

## 2018-05-18 LAB — MICROALBUMIN / CREATININE URINE RATIO
Creatinine, Urine: 196.4 mg/dL
Microalb/Creat Ratio: 14 mg/g creat (ref 0–29)
Microalbumin, Urine: 28.1 ug/mL

## 2018-05-18 LAB — RENAL FUNCTION PANEL
Albumin: 4.5 g/dL (ref 3.6–4.6)
BUN/Creatinine Ratio: 24 (ref 12–28)
BUN: 28 mg/dL — ABNORMAL HIGH (ref 8–27)
CO2: 20 mmol/L (ref 20–29)
Calcium: 10.1 mg/dL (ref 8.7–10.3)
Chloride: 96 mmol/L (ref 96–106)
Creatinine, Ser: 1.18 mg/dL — ABNORMAL HIGH (ref 0.57–1.00)
GFR calc Af Amer: 50 mL/min/{1.73_m2} — ABNORMAL LOW (ref >59)
GFR calc non Af Amer: 43 mL/min/{1.73_m2} — ABNORMAL LOW (ref >59)
Glucose: 278 mg/dL — ABNORMAL HIGH (ref 65–99)
Phosphorus: 3.3 mg/dL (ref 3.0–4.3)
Potassium: 4.3 mmol/L (ref 3.5–5.2)
Sodium: 136 mmol/L (ref 134–144)

## 2018-05-18 LAB — HEMOGLOBIN A1C
Est. average glucose Bld gHb Est-mCnc: 174 mg/dL
Hgb A1c MFr Bld: 7.7 % — ABNORMAL HIGH (ref 4.8–5.6)

## 2018-05-21 ENCOUNTER — Other Ambulatory Visit: Payer: Self-pay

## 2018-05-21 DIAGNOSIS — E119 Type 2 diabetes mellitus without complications: Secondary | ICD-10-CM

## 2018-05-21 MED ORDER — EMPAGLIFLOZIN 10 MG PO TABS: 10.0000 mg | ORAL_TABLET | Freq: Every day | ORAL | 1 refills | Status: DC

## 2018-05-21 NOTE — Progress Notes (Unsigned)
Sent in jardiance

## 2018-05-31 ENCOUNTER — Ambulatory Visit: Payer: Medicare Other | Admitting: Family Medicine

## 2018-05-31 ENCOUNTER — Other Ambulatory Visit: Payer: Self-pay

## 2018-05-31 DIAGNOSIS — E119 Type 2 diabetes mellitus without complications: Secondary | ICD-10-CM

## 2018-05-31 MED ORDER — GLIPIZIDE 10 MG PO TABS: 10.0000 mg | ORAL_TABLET | Freq: Two times a day (BID) | ORAL | 3 refills | Status: DC

## 2018-05-31 NOTE — Progress Notes (Unsigned)
Replace jardiance with glipizide again

## 2018-06-06 ENCOUNTER — Other Ambulatory Visit: Payer: Self-pay

## 2018-06-06 DIAGNOSIS — I10 Essential (primary) hypertension: Secondary | ICD-10-CM

## 2018-06-06 MED ORDER — DILTIAZEM HCL ER COATED BEADS 120 MG PO CP24: 120.0000 mg | ORAL_CAPSULE | Freq: Every day | ORAL | 0 refills | Status: DC

## 2018-06-14 ENCOUNTER — Ambulatory Visit: Payer: Medicare Other | Admitting: Family Medicine

## 2018-07-11 ENCOUNTER — Ambulatory Visit (INDEPENDENT_AMBULATORY_CARE_PROVIDER_SITE_OTHER): Payer: Medicare Other | Admitting: Family Medicine

## 2018-07-11 ENCOUNTER — Encounter: Payer: Self-pay | Admitting: Family Medicine

## 2018-07-11 VITALS — Wt 185.0 lb

## 2018-07-11 DIAGNOSIS — M79604 Pain in right leg: Secondary | ICD-10-CM | POA: Diagnosis not present

## 2018-07-11 NOTE — Progress Notes (Signed)
Date:  07/11/2018   Name:  Kristina HumphreysBarbara J Wagner   DOB:  1934/09/07   MRN:  [id number]   Chief Complaint: Leg Pain (R) leg pain- is on blood thinners so can't take anti-inflammatories. Has had injections- next step surgery)  I connected withthis patient, Kristina RuskBarbara Wagner, by telephoneat the patient's home.  I verified that I am speaking with the correct person using two identifiers. This visit was conducted via telephone due to the Covid-19 outbreak from my office [facility] in [place], [place]. I discussed the limitations, risks, security and privacy concerns of performing an evaluation and management service by telephone. I also discussed with the patient that there may be a patient responsible charge related to this service. The patient expressed understanding and agreed to proceed.  Leg Pain  The incident occurred more than 1 week ago. There was no injury mechanism (pain for years). The pain is present in the right leg. The quality of the pain is described as burning. The pain is moderate. The pain has been constant since onset. Treatments tried: injections. The treatment provided moderate relief.    Review of Systems  Patient Active Problem List   Diagnosis Date Noted  . Recurrent major depressive disorder, in partial remission (HCC) 01/29/2018  . Diabetes mellitus without complication (HCC) 01/29/2018  . Gastroesophageal reflux disease 01/29/2018  . Essential hypertension 10/19/2016  . Thyromegaly 12/22/2015  . Hyperlipidemia 10/13/2015    No Known Allergies  Past Surgical History:  Procedure Laterality Date  . CATARACT EXTRACTION W/PHACO Left 09/12/2017   Procedure: CATARACT EXTRACTION PHACO AND INTRAOCULAR LENS PLACEMENT (IOC) LEFT DIABETES;  Surgeon: Lockie MolaBrasington, Chadwick, MD;  [facility];  Service: Ophthalmology;  Laterality: Left;  Diabetic - oral meds  . COLONOSCOPY  2006   had 2 polyps- benign    Social History   Tobacco Use  . Smoking  status: Former Smoker    Packs/day: 1.00    Years: 15.00    Pack years: 15.00    Types: Cigarettes    Quit date: 01/17/1983    Years since quitting: 35.5  . Smokeless tobacco: Never Used  Substance Use Topics  . Alcohol use: No    Alcohol/week: 0.0 standard drinks  . Drug use: No     Medication list has been reviewed and updated.  Current Meds  Medication Sig  . ALPRAZolam (XANAX) 0.25 MG tablet Take 0.25 mg by mouth 2 (two) times daily. [facility]  . Apoaequorin (PREVAGEN PO) Take 1 tablet by mouth daily.   . ARIPiprazole (ABILIFY) 2 MG tablet Take 1 mg by mouth daily.   . B Complex Vitamins (VITAMIN B COMPLEX PO) Take 1 tablet by mouth daily.   . Cyanocobalamin (VITAMIN B12 PO) Take 1 tablet by mouth daily.   . diclofenac sodium (VOLTAREN) 1 % GEL Apply topically.  Marland Kitchen. diltiazem (CARDIZEM CD) 120 MG 24 hr capsule Take 1 capsule (120 mg total) by mouth daily.  . furosemide (LASIX) 20 MG tablet Take 1 tablet (20 mg total) by mouth daily as needed.  Marland Kitchen. glipiZIDE (GLUCOTROL) 10 MG tablet Take 1 tablet (10 mg total) by mouth 2 (two) times daily before a meal.  . hydrALAZINE (APRESOLINE) 25 MG tablet Take 1 tablet (25 mg total) by mouth 2 (two) times daily.  . lansoprazole (PREVACID) 15 MG capsule Take 1 capsule (15 mg total) by mouth daily as needed.  . metoprolol tartrate (LOPRESSOR) 100 MG tablet Take 1 tablet (100 mg total) by mouth  2 (two) times daily.  . Multiple Vitamins-Minerals (MULTIVITAL PO) Take 1 tablet by mouth daily.  . Omega-3 Fatty Acids (FISH OIL) 1200 MG CAPS Take 1 capsule (1,200 mg total) by mouth daily.  Glory Rosebush VERIO test strip USE 1 STRIP TO CHECK GLUCOSE ONCE DAILY  . rivaroxaban (XARELTO) 20 MG TABS tablet Take 1 tablet by mouth daily. Kristina Wagner  . simvastatin (ZOCOR) 40 MG tablet Take 1 tablet (40 mg total) by mouth daily.  . valsartan (DIOVAN) 320 MG tablet Take 1 tablet (320 mg total) by mouth daily.  Marland Kitchen venlafaxine XR (EFFEXOR-XR) 150 MG 24 hr capsule Take 1  capsule (150 mg total) by mouth daily with breakfast.    PHQ 2/9 Scores 07/11/2018 05/17/2018 01/29/2018 09/21/2017  PHQ - 2 Score 0 5 0 4  PHQ- 9 Score 0 9 0 8    BP Readings from Last 3 Encounters:  05/17/18 120/70  01/29/18 110/72  12/06/17 134/82    Physical Exam  Wt Readings from Last 3 Encounters:  07/11/18 185 lb (83.9 kg)  05/17/18 188 lb (85.3 kg)  01/29/18 188 lb (85.3 kg)    Wt 185 lb (83.9 kg)   BMI 30.79 kg/m   Assessment and Plan:  1. Pain of right lower extremity Pain has recurrence of right leg pain that is reminiscent of when she had a injection by Kristina Wagner this this in February 2019.  Have instructed pain patient not to take any other treatment modalities thinking that she is on multiple medications that would be contraindicated to certain med medications.  This is suggestive of a radicular type of pain radiating to the leg.  Previously she has been seen by Kristina Wagner for injection of a bursa and knee.  We will refer to orthopedics and Kristina Wagner for evaluation and treatment and consideration for possible referral to Kristina Wagner.- Ambulatory referral to Orthopedic Surgery I spent 10 minutes with this patient, More than 50% of that time was spent in face to face education, counseling and care coordination.

## 2018-08-20 ENCOUNTER — Other Ambulatory Visit: Payer: Self-pay

## 2018-08-20 DIAGNOSIS — I10 Essential (primary) hypertension: Secondary | ICD-10-CM

## 2018-08-20 MED ORDER — VALSARTAN 320 MG PO TABS: 320.0000 mg | ORAL_TABLET | Freq: Every day | ORAL | 0 refills | Status: DC

## 2018-08-20 MED ORDER — FUROSEMIDE 20 MG PO TABS: 20.0000 mg | ORAL_TABLET | Freq: Every day | ORAL | 0 refills | Status: DC | PRN

## 2018-08-20 NOTE — Progress Notes (Unsigned)
Sent in furosemide and valsartan for pt- she has an appt scheduled for tomorrow for the rest of her meds

## 2018-08-21 ENCOUNTER — Other Ambulatory Visit: Payer: Self-pay

## 2018-08-21 ENCOUNTER — Encounter: Payer: Self-pay | Admitting: Family Medicine

## 2018-08-21 ENCOUNTER — Ambulatory Visit: Payer: Medicare Other | Admitting: Family Medicine

## 2018-08-21 VITALS — BP 136/62 | HR 60 | Ht 65.0 in | Wt 185.0 lb

## 2018-08-21 DIAGNOSIS — I10 Essential (primary) hypertension: Secondary | ICD-10-CM | POA: Diagnosis not present

## 2018-08-21 DIAGNOSIS — K219 Gastro-esophageal reflux disease without esophagitis: Secondary | ICD-10-CM | POA: Diagnosis not present

## 2018-08-21 DIAGNOSIS — E782 Mixed hyperlipidemia: Secondary | ICD-10-CM | POA: Diagnosis not present

## 2018-08-21 DIAGNOSIS — E119 Type 2 diabetes mellitus without complications: Secondary | ICD-10-CM | POA: Diagnosis not present

## 2018-08-21 DIAGNOSIS — F3341 Major depressive disorder, recurrent, in partial remission: Secondary | ICD-10-CM

## 2018-08-21 MED ORDER — SIMVASTATIN 40 MG PO TABS: 40.0000 mg | ORAL_TABLET | Freq: Every day | ORAL | 1 refills | Status: AC

## 2018-08-21 MED ORDER — HYDRALAZINE HCL 25 MG PO TABS: 25.0000 mg | ORAL_TABLET | Freq: Two times a day (BID) | ORAL | 1 refills | Status: AC

## 2018-08-21 MED ORDER — DILTIAZEM HCL ER COATED BEADS 120 MG PO CP24: 120.0000 mg | ORAL_CAPSULE | Freq: Every day | ORAL | 0 refills | Status: DC

## 2018-08-21 MED ORDER — VENLAFAXINE HCL ER 150 MG PO CP24: 150.0000 mg | ORAL_CAPSULE | Freq: Every day | ORAL | 1 refills | Status: DC

## 2018-08-21 MED ORDER — FUROSEMIDE 20 MG PO TABS: 20.0000 mg | ORAL_TABLET | Freq: Every day | ORAL | 0 refills | Status: DC | PRN

## 2018-08-21 MED ORDER — METOPROLOL TARTRATE 100 MG PO TABS: 100.0000 mg | ORAL_TABLET | Freq: Two times a day (BID) | ORAL | 1 refills | Status: DC

## 2018-08-21 MED ORDER — LANSOPRAZOLE 15 MG PO CPDR: 15.0000 mg | DELAYED_RELEASE_CAPSULE | Freq: Every day | ORAL | 1 refills | Status: DC | PRN

## 2018-08-21 NOTE — Progress Notes (Signed)
Date:  08/21/2018   Name:  Kristina Wagner   DOB:  1934/05/11   MRN:  [id number]   Chief Complaint: Diabetes (only taking glip bid)  Diabetes She presents for her follow-up diabetic visit. She has type 2 diabetes mellitus. Her disease course has been fluctuating. There are no hypoglycemic associated symptoms. Pertinent negatives for hypoglycemia include no dizziness, headaches, nervousness/anxiousness or sweats. There are no diabetic associated symptoms. Pertinent negatives for diabetes include no blurred vision, no chest pain, no fatigue, no foot paresthesias, no foot ulcerations, no polydipsia, no polyphagia, no polyuria, no visual change, no weakness and no weight loss. (Nocturia x 1) There are no hypoglycemic complications. Symptoms are stable. Current diabetic treatment includes oral agent (monotherapy). Her weight is stable. She is following a generally unhealthy ("frosty diet") diet. Meal planning includes carbohydrate counting. She participates in exercise intermittently. Her breakfast blood glucose is taken between 8-9 am. Her breakfast blood glucose range is generally 140-180 mg/dl. An ACE inhibitor/angiotensin II receptor blocker is being taken.  Hypertension This is a chronic problem. The current episode started more than 1 year ago. The problem has been waxing and waning since onset. Pertinent negatives include no anxiety, blurred vision, chest pain, headaches, malaise/fatigue, neck pain, orthopnea, palpitations, peripheral edema, PND, shortness of breath or sweats. There are no associated agents to hypertension. Risk factors for coronary artery disease include diabetes mellitus and dyslipidemia. Past treatments include diuretics, direct vasodilators, calcium channel blockers, angiotensin blockers and beta blockers. The current treatment provides moderate improvement. There are no compliance problems.   Gastroesophageal Reflux She reports no abdominal pain, no chest pain, no choking, no  coughing, no dysphagia, no heartburn, no nausea, no sore throat or no wheezing. This is a recurrent problem. The problem has been gradually improving. Pertinent negatives include no fatigue or weight loss.  Hyperlipidemia This is a chronic problem. The current episode started more than 1 year ago. The problem is controlled. Recent lipid tests were reviewed and are normal. She has no history of nephrotic syndrome. Pertinent negatives include no chest pain, myalgias or shortness of breath. Current antihyperlipidemic treatment includes statins.    Review of Systems  Constitutional: Negative.  Negative for chills, fatigue, fever, malaise/fatigue, unexpected weight change and weight loss.  HENT: Negative for congestion, ear discharge, ear pain, rhinorrhea, sinus pressure, sneezing and sore throat.   Eyes: Negative for blurred vision, photophobia, pain, discharge, redness and itching.  Respiratory: Negative for cough, choking, shortness of breath, wheezing and stridor.   Cardiovascular: Negative for chest pain, palpitations, orthopnea and PND.  Gastrointestinal: Negative for abdominal pain, blood in stool, constipation, diarrhea, dysphagia, heartburn, nausea and vomiting.  Endocrine: Negative for cold intolerance, heat intolerance, polydipsia, polyphagia and polyuria.  Genitourinary: Negative for dysuria, flank pain, frequency, hematuria, menstrual problem, pelvic pain, urgency, vaginal bleeding and vaginal discharge.  Musculoskeletal: Negative for arthralgias, back pain, myalgias and neck pain.  Skin: Negative for rash.  Allergic/Immunologic: Negative for environmental allergies and food allergies.  Neurological: Negative for dizziness, weakness, light-headedness, numbness and headaches.  Hematological: Negative for adenopathy. Does not bruise/bleed easily.  Psychiatric/Behavioral: Negative for dysphoric mood. The patient is not nervous/anxious.     Patient Active Problem List   Diagnosis Date  Noted  . Recurrent major depressive disorder, in partial remission ([place]) 01/29/2018  . Diabetes mellitus without complication ([place]) 87/86/7672  . Gastroesophageal reflux disease 01/29/2018  . Essential hypertension 10/19/2016  . Thyromegaly 12/22/2015  . Hyperlipidemia 10/13/2015    No Known  Allergies  Past Surgical History:  Procedure Laterality Date  . CATARACT EXTRACTION W/PHACO Left 09/12/2017   Procedure: CATARACT EXTRACTION PHACO AND INTRAOCULAR LENS PLACEMENT (IOC) LEFT DIABETES;  Surgeon: Lockie MolaBrasington, Chadwick, MD;  [facility];  Service: Ophthalmology;  Laterality: Left;  Diabetic - oral meds  . COLONOSCOPY  2006   had 2 polyps- benign    Social History   Tobacco Use  . Smoking status: Former Smoker    Packs/day: 1.00    Years: 15.00    Pack years: 15.00    Types: Cigarettes    Quit date: 01/17/1983    Years since quitting: 35.6  . Smokeless tobacco: Never Used  Substance Use Topics  . Alcohol use: No    Alcohol/week: 0.0 standard drinks  . Drug use: No     Medication list has been reviewed and updated.  Current Meds  Medication Sig  . ALPRAZolam (XANAX) 0.25 MG tablet Take 0.25 mg by mouth 2 (two) times daily. [facility]  . Apoaequorin (PREVAGEN PO) Take 1 tablet by mouth daily.   . ARIPiprazole (ABILIFY) 2 MG tablet Take 1 mg by mouth daily.   . B Complex Vitamins (VITAMIN B COMPLEX PO) Take 1 tablet by mouth daily.   . Cyanocobalamin (VITAMIN B12 PO) Take 1 tablet by mouth daily.   . diclofenac sodium (VOLTAREN) 1 % GEL Apply topically.  Marland Kitchen. diltiazem (CARDIZEM CD) 120 MG 24 hr capsule Take 1 capsule (120 mg total) by mouth daily.  . furosemide (LASIX) 20 MG tablet Take 1 tablet (20 mg total) by mouth daily as needed.  Marland Kitchen. glipiZIDE (GLUCOTROL) 10 MG tablet Take 1 tablet (10 mg total) by mouth 2 (two) times daily before a meal.  . hydrALAZINE (APRESOLINE) 25 MG tablet Take 1 tablet (25 mg total) by mouth 2 (two) times daily.  . lansoprazole (PREVACID)  15 MG capsule Take 1 capsule (15 mg total) by mouth daily as needed.  . metoprolol tartrate (LOPRESSOR) 100 MG tablet Take 1 tablet (100 mg total) by mouth 2 (two) times daily.  . Multiple Vitamins-Minerals (MULTIVITAL PO) Take 1 tablet by mouth daily.  . Omega-3 Fatty Acids (FISH OIL) 1200 MG CAPS Take 1 capsule (1,200 mg total) by mouth daily.  Letta Pate. ONETOUCH VERIO test strip USE 1 STRIP TO CHECK GLUCOSE ONCE DAILY  . rivaroxaban (XARELTO) 20 MG TABS tablet Take 1 tablet by mouth daily. Dr Allena KatzPatel  . simvastatin (ZOCOR) 40 MG tablet Take 1 tablet (40 mg total) by mouth daily.  . valsartan (DIOVAN) 320 MG tablet Take 1 tablet (320 mg total) by mouth daily.  Marland Kitchen. venlafaxine XR (EFFEXOR-XR) 150 MG 24 hr capsule Take 1 capsule (150 mg total) by mouth daily with breakfast.    PHQ 2/9 Scores 07/11/2018 05/17/2018 01/29/2018 09/21/2017  PHQ - 2 Score 0 5 0 4  PHQ- 9 Score 0 9 0 8    BP Readings from Last 3 Encounters:  08/21/18 136/62  05/17/18 120/70  01/29/18 110/72    Physical Exam Vitals signs and nursing note reviewed.  Constitutional:      Appearance: She is well-developed.  HENT:     Head: Normocephalic.     Right Ear: Tympanic membrane, ear canal and external ear normal. There is no impacted cerumen.     Left Ear: Tympanic membrane, ear canal and external ear normal. There is no impacted cerumen.     Nose: Nose normal. No congestion or rhinorrhea.     Mouth/Throat:  Mouth: Mucous membranes are moist.  Eyes:     General: Lids are everted, no foreign bodies appreciated. No scleral icterus.       Left eye: No foreign body or hordeolum.     Conjunctiva/sclera: Conjunctivae normal.     Right eye: Right conjunctiva is not injected.     Left eye: Left conjunctiva is not injected.     Pupils: Pupils are equal, round, and reactive to light.  Neck:     Musculoskeletal: Normal range of motion and neck supple.     Thyroid: No thyromegaly.     Vascular: No JVD.     Trachea: No tracheal  deviation.  Cardiovascular:     Rate and Rhythm: Normal rate and regular rhythm.     Heart sounds: Normal heart sounds. No murmur. No friction rub. No gallop.   Pulmonary:     Effort: Pulmonary effort is normal. No respiratory distress.     Breath sounds: Normal breath sounds. No stridor. No wheezing, rhonchi or rales.  Chest:     Chest wall: No tenderness.  Abdominal:     General: Bowel sounds are normal.     Palpations: Abdomen is soft. There is no mass.     Tenderness: There is no abdominal tenderness. There is no guarding or rebound.  Musculoskeletal: Normal range of motion.        General: No tenderness.  Lymphadenopathy:     Cervical: No cervical adenopathy.  Skin:    General: Skin is warm.     Findings: No rash.  Neurological:     Mental Status: She is alert and oriented to person, place, and time.     Cranial Nerves: No cranial nerve deficit.     Deep Tendon Reflexes: Reflexes normal.  Psychiatric:        Mood and Affect: Mood is not anxious or depressed.     Wt Readings from Last 3 Encounters:  08/21/18 185 lb (83.9 kg)  07/11/18 185 lb (83.9 kg)  05/17/18 188 lb (85.3 kg)    BP 136/62   Pulse 60   Ht 5\' 5"  (1.651 m)   Wt 185 lb (83.9 kg)   SpO2 94%   BMI 30.79 kg/m   Assessment and Plan: Patient presents for medication refills. 1. Diabetes mellitus without complication [facility](HCC) Patient has been unable to tolerate the cost of Jardiance.  Patient will continue glipizide 10 mg twice a day.  We will check an A1c.  Patient was reemphasized to maintain a stricter diet of avoidance of concentrated sweets such as prosthesis and limiting carbohydrate.  We will check an A1c today. - Hemoglobin A1c  2. Essential hypertension Chronic.  Controlled.  Continue Lopressor 100 mg 1 twice a day diltiazem 120 mg 24-hour tablet once a day furosemide 20 mg once a day and hydralazine 25 mg 1 twice a day. - metoprolol tartrate (LOPRESSOR) 100 MG tablet; Take 1 tablet (100 mg total)  by mouth 2 (two) times daily.  Dispense: 180 tablet; Refill: 1 - diltiazem (CARDIZEM CD) 120 MG 24 hr capsule; Take 1 capsule (120 mg total) by mouth daily.  Dispense: 90 capsule; Refill: 0 - furosemide (LASIX) 20 MG tablet; Take 1 tablet (20 mg total) by mouth daily as needed.  Dispense: 90 tablet; Refill: 0 - hydrALAZINE (APRESOLINE) 25 MG tablet; Take 1 tablet (25 mg total) by mouth 2 (two) times daily.  Dispense: 180 tablet; Refill: 1  3. Gastroesophageal reflux disease, esophagitis presence not specified Chronic.  Controlled.  Continue Prevacid 15 mg once a day - lansoprazole (PREVACID) 15 MG capsule; Take 1 capsule (15 mg total) by mouth daily as needed.  Dispense: 90 capsule; Refill: 1  4. Mixed hyperlipidemia Chronic.  Controlled.  Continue simvastatin 40 mg once a day. - simvastatin (ZOCOR) 40 MG tablet; Take 1 tablet (40 mg total) by mouth daily.  Dispense: 90 tablet; Refill: 1  5. Recurrent major depressive disorder, in partial remission (HCC) Chronic.  Continue venlafaxine XR 150 mg once a day. - venlafaxine XR (EFFEXOR-XR) 150 MG 24 hr capsule; Take 1 capsule (150 mg total) by mouth daily with breakfast.  Dispense: 90 capsule; Refill: 1

## 2018-08-21 NOTE — Patient Instructions (Signed)

## 2018-08-22 LAB — HEMOGLOBIN A1C
Est. average glucose Bld gHb Est-mCnc: 166 mg/dL
Hgb A1c MFr Bld: 7.4 % — ABNORMAL HIGH (ref 4.8–5.6)

## 2018-09-14 ENCOUNTER — Other Ambulatory Visit: Payer: Self-pay | Admitting: Family Medicine

## 2018-09-14 DIAGNOSIS — I10 Essential (primary) hypertension: Secondary | ICD-10-CM

## 2018-09-26 ENCOUNTER — Other Ambulatory Visit: Payer: Self-pay

## 2018-09-26 ENCOUNTER — Other Ambulatory Visit: Payer: Self-pay | Admitting: Family Medicine

## 2018-09-26 DIAGNOSIS — E119 Type 2 diabetes mellitus without complications: Secondary | ICD-10-CM

## 2018-09-26 MED ORDER — GLIPIZIDE 10 MG PO TABS: 10.0000 mg | ORAL_TABLET | Freq: Two times a day (BID) | ORAL | 1 refills | Status: DC

## 2018-10-20 ENCOUNTER — Other Ambulatory Visit: Payer: Self-pay | Admitting: Family Medicine

## 2018-10-21 ENCOUNTER — Other Ambulatory Visit: Payer: Self-pay

## 2018-10-21 DIAGNOSIS — E119 Type 2 diabetes mellitus without complications: Secondary | ICD-10-CM

## 2018-10-21 MED ORDER — ONETOUCH VERIO VI STRP: ORAL_STRIP | 2 refills | Status: DC

## 2018-11-09 ENCOUNTER — Other Ambulatory Visit: Payer: Self-pay | Admitting: Family Medicine

## 2018-12-05 ENCOUNTER — Other Ambulatory Visit: Payer: Self-pay

## 2018-12-05 ENCOUNTER — Ambulatory Visit (INDEPENDENT_AMBULATORY_CARE_PROVIDER_SITE_OTHER): Payer: Medicare Other | Admitting: Family Medicine

## 2018-12-05 ENCOUNTER — Encounter: Payer: Self-pay | Admitting: Family Medicine

## 2018-12-05 VITALS — BP 122/70 | HR 60 | Ht 65.0 in | Wt 176.0 lb

## 2018-12-05 DIAGNOSIS — E010 Iodine-deficiency related diffuse (endemic) goiter: Secondary | ICD-10-CM | POA: Diagnosis not present

## 2018-12-05 DIAGNOSIS — I10 Essential (primary) hypertension: Secondary | ICD-10-CM

## 2018-12-05 DIAGNOSIS — E119 Type 2 diabetes mellitus without complications: Secondary | ICD-10-CM | POA: Diagnosis not present

## 2018-12-05 DIAGNOSIS — F3341 Major depressive disorder, recurrent, in partial remission: Secondary | ICD-10-CM

## 2018-12-05 MED ORDER — DILTIAZEM HCL ER COATED BEADS 120 MG PO CP24: 120.0000 mg | ORAL_CAPSULE | Freq: Every day | ORAL | 0 refills | Status: DC

## 2018-12-05 NOTE — Progress Notes (Signed)
Date:  12/05/2018   Name:  Kristina Wagner   DOB:  06-Dec-1934   MRN:  [id number]   Chief Complaint: Diabetes  Diabetes She presents for her follow-up diabetic visit. She has type 2 diabetes mellitus. Her disease course has been fluctuating. There are no hypoglycemic associated symptoms. Pertinent negatives for hypoglycemia include no nervousness/anxiousness or tremors. Associated symptoms include fatigue. Pertinent negatives for diabetes include no blurred vision, no chest pain, no foot paresthesias, no foot ulcerations, no polydipsia, no polyphagia, no polyuria, no visual change, no weakness and no weight loss. There are no hypoglycemic complications. Symptoms are stable. There are no diabetic complications. There are no known risk factors for coronary artery disease. Current diabetic treatment includes oral agent (monotherapy). She is compliant with treatment some of the time. There is no change in her home blood glucose trend. Her breakfast blood glucose is taken between 8-9 am. An ACE inhibitor/angiotensin II receptor blocker is being taken.  Thyroid Problem Presents for initial visit. Symptoms include depressed mood, diaphoresis, diarrhea and fatigue. Patient reports no anxiety, cold intolerance, constipation, dry skin, hair loss, heat intolerance, hoarse voice, leg swelling, nail problem, palpitations, tremors, visual change, weight gain or weight loss. Symptom course: fluctuates. Past treatments include nothing.  Depression        Associated symptoms include fatigue, appetite change and myalgias.  Past medical history includes thyroid problem.     Lab Results  Component Value Date   CREATININE 1.18 (H) 05/17/2018   BUN 28 (H) 05/17/2018   NA 136 05/17/2018   K 4.3 05/17/2018   CL 96 05/17/2018   CO2 20 05/17/2018   Lab Results  Component Value Date   CHOL 156 10/19/2016   HDL 55 10/19/2016   LDLCALC 69 10/19/2016   TRIG 161 (H) 10/19/2016   CHOLHDL 2.8 10/19/2016   Lab  Results  Component Value Date   TSH 2.150 01/29/2018   Lab Results  Component Value Date   HGBA1C 7.4 (H) 08/21/2018     Review of Systems  Constitutional: Positive for appetite change, diaphoresis, fatigue and unexpected weight change. Negative for activity change, chills, fever, weight gain and weight loss.  HENT: Positive for sinus pressure. Negative for congestion, dental problem, drooling, ear discharge, ear pain, facial swelling, hoarse voice, nosebleeds and postnasal drip.   Eyes: Negative for blurred vision, pain, discharge, redness and itching.  Respiratory: Negative for cough, chest tightness, shortness of breath and wheezing.   Cardiovascular: Negative for chest pain, palpitations and leg swelling.  Gastrointestinal: Positive for diarrhea. Negative for abdominal distention, abdominal pain, anal bleeding, blood in stool, constipation, nausea, rectal pain and vomiting.  Endocrine: Negative for cold intolerance, heat intolerance, polydipsia, polyphagia and polyuria.  Genitourinary: Negative for decreased urine volume, dyspareunia, dysuria, enuresis, frequency, hematuria and urgency.  Musculoskeletal: Positive for myalgias. Negative for arthralgias and back pain.  Skin: Negative for rash.  Neurological: Negative for tremors and weakness.  Hematological: Negative for adenopathy. Does not bruise/bleed easily.  Psychiatric/Behavioral: Positive for depression. The patient is not nervous/anxious.     Patient Active Problem List   Diagnosis Date Noted  . Recurrent major depressive disorder, in partial remission ([place]) 01/29/2018  . Diabetes mellitus without complication ([place]) 68/12/7515  . Gastroesophageal reflux disease 01/29/2018  . Essential hypertension 10/19/2016  . Thyromegaly 12/22/2015  . Hyperlipidemia 10/13/2015    No Known Allergies  Past Surgical History:  Procedure Laterality Date  . CATARACT EXTRACTION W/PHACO Left 09/12/2017   Procedure: CATARACT EXTRACTION  PHACO AND INTRAOCULAR LENS PLACEMENT (IOC) LEFT DIABETES;  Surgeon: Lockie MolaBrasington, Chadwick, MD;  [facility];  Service: Ophthalmology;  Laterality: Left;  Diabetic - oral meds  . COLONOSCOPY  2006   had 2 polyps- benign    Social History   Tobacco Use  . Smoking status: Former Smoker    Packs/day: 1.00    Years: 15.00    Pack years: 15.00    Types: Cigarettes    Quit date: 01/17/1983    Years since quitting: 35.9  . Smokeless tobacco: Never Used  Substance Use Topics  . Alcohol use: No    Alcohol/week: 0.0 standard drinks  . Drug use: No     Medication list has been reviewed and updated.  Current Meds  Medication Sig  . ALPRAZolam (XANAX) 0.25 MG tablet Take 0.25 mg by mouth 2 (two) times daily. [facility]  . Apoaequorin (PREVAGEN PO) Take 1 tablet by mouth daily.   . Cyanocobalamin (VITAMIN B12 PO) Take 1 tablet by mouth daily.   Marland Kitchen. diltiazem (CARDIZEM CD) 120 MG 24 hr capsule Take 1 capsule (120 mg total) by mouth daily.  . furosemide (LASIX) 20 MG tablet Take 1 tablet (20 mg total) by mouth daily as needed.  Marland Kitchen. glipiZIDE (GLUCOTROL) 10 MG tablet Take 1 tablet (10 mg total) by mouth 2 (two) times daily before a meal.  . glucose blood (ONETOUCH VERIO) test strip USE 1 STRIP TO CHECK GLUCOSE ONCE DAILY  . hydrALAZINE (APRESOLINE) 25 MG tablet Take 1 tablet (25 mg total) by mouth 2 (two) times daily.  . lansoprazole (PREVACID) 15 MG capsule Take 1 capsule (15 mg total) by mouth daily as needed.  . metoprolol tartrate (LOPRESSOR) 100 MG tablet Take 1 tablet (100 mg total) by mouth 2 (two) times daily.  . Multiple Vitamins-Minerals (MULTIVITAL PO) Take 1 tablet by mouth daily.  . Omega-3 Fatty Acids (FISH OIL) 1200 MG CAPS Take 1 capsule (1,200 mg total) by mouth daily.  . rivaroxaban (XARELTO) 20 MG TABS tablet Take 1 tablet by mouth daily. Dr Allena KatzPatel  . simvastatin (ZOCOR) 40 MG tablet Take 1 tablet (40 mg total) by mouth daily.  . valsartan (DIOVAN) 320 MG tablet TAKE 1  TABLET BY MOUTH EVERY DAY  . venlafaxine XR (EFFEXOR-XR) 150 MG 24 hr capsule Take 1 capsule (150 mg total) by mouth daily with breakfast.    PHQ 2/9 Scores 07/11/2018 05/17/2018 01/29/2018 09/21/2017  PHQ - 2 Score 0 5 0 4  PHQ- 9 Score 0 9 0 8    BP Readings from Last 3 Encounters:  12/05/18 122/70  08/21/18 136/62  05/17/18 120/70    Physical Exam Vitals signs and nursing note reviewed.  Constitutional:      Appearance: She is well-developed.  HENT:     Head: Normocephalic.     Right Ear: Tympanic membrane, ear canal and external ear normal.     Left Ear: Tympanic membrane, ear canal and external ear normal.     Nose: Nose normal.     Mouth/Throat:     Mouth: Mucous membranes are moist.  Eyes:     General: Lids are everted, no foreign bodies appreciated. No scleral icterus.       Left eye: No foreign body or hordeolum.     Conjunctiva/sclera: Conjunctivae normal.     Right eye: Right conjunctiva is not injected.     Left eye: Left conjunctiva is not injected.     Pupils: Pupils are equal, round, and reactive  to light.  Neck:     Musculoskeletal: Normal range of motion and neck supple.     Thyroid: No thyromegaly.     Vascular: No JVD.     Trachea: No tracheal deviation.  Cardiovascular:     Rate and Rhythm: Normal rate and regular rhythm.     Heart sounds: Normal heart sounds. No murmur. No friction rub. No gallop.   Pulmonary:     Effort: Pulmonary effort is normal. No respiratory distress.     Breath sounds: Normal breath sounds. No wheezing or rales.  Abdominal:     General: Bowel sounds are normal.     Palpations: Abdomen is soft. There is no mass.     Tenderness: There is no abdominal tenderness. There is no guarding or rebound.  Musculoskeletal: Normal range of motion.        General: No tenderness.  Lymphadenopathy:     Cervical: No cervical adenopathy.  Skin:    General: Skin is warm.     Findings: No rash.  Neurological:     Mental Status: She is alert  and oriented to person, place, and time.     Cranial Nerves: No cranial nerve deficit.     Deep Tendon Reflexes: Reflexes normal.  Psychiatric:        Mood and Affect: Mood is not anxious or depressed.     Wt Readings from Last 3 Encounters:  12/05/18 176 lb (79.8 kg)  08/21/18 185 lb (83.9 kg)  07/11/18 185 lb (83.9 kg)    BP 122/70   Pulse 60   Ht 5\' 5"  (1.651 m)   Wt 176 lb (79.8 kg)   BMI 29.29 kg/m   Assessment and Plan: 1. Type 2 diabetes mellitus without complication, without long-term current use of insulin (HCC) We had a long discussion about the patient's inability to take medicine or just does not feel like that they work.  Patient has had elevated A1c's and I am concerned about her poor diabetic control but patient is not very helpful as far as taking her medications nor with her dietary approach.  We will check an A1c and if she remains in the diabetic region we will anticipate referring back to endocrinology for more sophisticated approach than glipizide.  In the meantime she has been encouraged to continue her glipizide's and since she has not been able to take Metformin this will be held in the time being. - Hemoglobin A1c - Renal Function Panel  2. Recurrent major depressive disorder, in partial remission (HCC) Low patient's PHQ is 0 I rather doubt that this is exactly what it is because she says she spends her day just sitting in a chair and it sounds like she is indeed depressed she is followed by psychiatry but she has stopped her Abilify because is not working and I have concerns about her making her own choices and she has not had a formal recheck with her psychiatrist in quite some time.  She is to have a virtual visit on December 4 and she has been encouraged to definitely maintain this appointment.  3. Thyromegaly Patient has a history of thyromegaly this was last evaluated with a TSH about 8 months ago.  We will repeat a TSH given that she has had a recent  weight loss of indeterminate reason.  I am concerned that the weight loss may be due to her diabetic control being suboptimal. - Thyroid Panel With TSH

## 2018-12-06 LAB — HEMOGLOBIN A1C
Est. average glucose Bld gHb Est-mCnc: 163 mg/dL
Hgb A1c MFr Bld: 7.3 % — ABNORMAL HIGH (ref 4.8–5.6)

## 2018-12-06 LAB — RENAL FUNCTION PANEL
Albumin: 4.6 g/dL (ref 3.6–4.6)
BUN/Creatinine Ratio: 17 (ref 12–28)
BUN: 16 mg/dL (ref 8–27)
CO2: 23 mmol/L (ref 20–29)
Calcium: 10.2 mg/dL (ref 8.7–10.3)
Chloride: 99 mmol/L (ref 96–106)
Creatinine, Ser: 0.94 mg/dL (ref 0.57–1.00)
GFR calc Af Amer: 65 mL/min/{1.73_m2} (ref >59)
GFR calc non Af Amer: 57 mL/min/{1.73_m2} — ABNORMAL LOW (ref >59)
Glucose: 170 mg/dL — ABNORMAL HIGH (ref 65–99)
Phosphorus: 3.7 mg/dL (ref 3.0–4.3)
Potassium: 5.1 mmol/L (ref 3.5–5.2)
Sodium: 139 mmol/L (ref 134–144)

## 2018-12-06 LAB — THYROID PANEL WITH TSH
Free Thyroxine Index: 1.5 (ref 1.2–4.9)
T3 Uptake Ratio: 24 % (ref 24–39)
T4, Total: 6.3 ug/dL (ref 4.5–12.0)
TSH: 1.29 u[IU]/mL (ref 0.450–4.500)

## 2018-12-18 ENCOUNTER — Ambulatory Visit (INDEPENDENT_AMBULATORY_CARE_PROVIDER_SITE_OTHER): Payer: Medicare Other

## 2018-12-18 DIAGNOSIS — Z0000 Encounter for general adult medical examination without abnormal findings: Secondary | ICD-10-CM | POA: Diagnosis not present

## 2018-12-18 NOTE — Patient Instructions (Signed)
Kristina Wagner , Thank you for taking time to come for your Medicare Wellness Visit. I appreciate your ongoing commitment to your health goals. Please review the following plan we discussed and let me know if I can assist you in the future.   Screening recommendations/referrals: Colonoscopy: no longer required Mammogram: no longer required Bone Density: postponed Recommended yearly ophthalmology/optometry visit for glaucoma screening and checkup Recommended yearly dental visit for hygiene and checkup  Vaccinations: Influenza vaccine: done 11/18/18 Pneumococcal vaccine: done 10/22/14 Tdap vaccine: due  Shingles vaccine: 1st dose of Shingrix done 11/18/18    Advanced directives: Please bring a copy of your health care power of attorney and living will to the office at your convenience.  Conditions/risks identified: Recommend drinking 6-8 glasses of water per day  Next appointment: Please follow up in one year for your Medicare Annual Wellness visit.     Preventive Care 65 Years and Older, Female Preventive care refers to lifestyle choices and visits with your health care provider that can promote health and wellness. What does preventive care include?  A yearly physical exam. This is also called an annual well check.  Dental exams once or twice a year.  Routine eye exams. Ask your health care provider how often you should have your eyes checked.  Personal lifestyle choices, including:  Daily care of your teeth and gums.  Regular physical activity.  Eating a healthy diet.  Avoiding tobacco and drug use.  Limiting alcohol use.  Practicing safe sex.  Taking low-dose aspirin every day.  Taking vitamin and mineral supplements as recommended by your health care provider. What happens during an annual well check? The services and screenings done by your health care provider during your annual well check will depend on your age, overall health, lifestyle risk factors, and family  history of disease. Counseling  Your health care provider may ask you questions about your:  Alcohol use.  Tobacco use.  Drug use.  Emotional well-being.  Home and relationship well-being.  Sexual activity.  Eating habits.  History of falls.  Memory and ability to understand (cognition).  Work and work Statistician.  Reproductive health. Screening  You may have the following tests or measurements:  Height, weight, and BMI.  Blood pressure.  Lipid and cholesterol levels. These may be checked every 5 years, or more frequently if you are over 69 years old.  Skin check.  Lung cancer screening. You may have this screening every year starting at age 56 if you have a 30-pack-year history of smoking and currently smoke or have quit within the past 15 years.  Fecal occult blood test (FOBT) of the stool. You may have this test every year starting at age 83.  Flexible sigmoidoscopy or colonoscopy. You may have a sigmoidoscopy every 5 years or a colonoscopy every 10 years starting at age 37.  Hepatitis C blood test.  Hepatitis B blood test.  Sexually transmitted disease (STD) testing.  Diabetes screening. This is done by checking your blood sugar (glucose) after you have not eaten for a while (fasting). You may have this done every 1-3 years.  Bone density scan. This is done to screen for osteoporosis. You may have this done starting at age 75.  Mammogram. This may be done every 1-2 years. Talk to your health care provider about how often you should have regular mammograms. Talk with your health care provider about your test results, treatment options, and if necessary, the need for more tests. Vaccines  Your health  care provider may recommend certain vaccines, such as:  Influenza vaccine. This is recommended every year.  Tetanus, diphtheria, and acellular pertussis (Tdap, Td) vaccine. You may need a Td booster every 10 years.  Zoster vaccine. You may need this after  age 58.  Pneumococcal 13-valent conjugate (PCV13) vaccine. One dose is recommended after age 38.  Pneumococcal polysaccharide (PPSV23) vaccine. One dose is recommended after age 69. Talk to your health care provider about which screenings and vaccines you need and how often you need them. This information is not intended to replace advice given to you by your health care provider. Make sure you discuss any questions you have with your health care provider. Document Released: 01/29/2015 Document Revised: 09/22/2015 Document Reviewed: 11/03/2014 Elsevier Interactive Patient Education  2017 [place] Prevention in the Home Falls can cause injuries. They can happen to people of all ages. There are many things you can do to make your home safe and to help prevent falls. What can I do on the outside of my home?  Regularly fix the edges of walkways and driveways and fix any cracks.  Remove anything that might make you trip as you walk through a door, such as a raised step or threshold.  Trim any bushes or trees on the path to your home.  Use bright outdoor lighting.  Clear any walking paths of anything that might make someone trip, such as rocks or tools.  Regularly check to see if handrails are loose or broken. Make sure that both sides of any steps have handrails.  Any raised decks and porches should have guardrails on the edges.  Have any leaves, snow, or ice cleared regularly.  Use sand or salt on walking paths during winter.  Clean up any spills in your garage right away. This includes oil or grease spills. What can I do in the bathroom?  Use night lights.  Install grab bars by the toilet and in the tub and shower. Do not use towel bars as grab bars.  Use non-skid mats or decals in the tub or shower.  If you need to sit down in the shower, use a plastic, non-slip stool.  Keep the floor dry. Clean up any water that spills on the floor as soon as it happens.   Remove soap buildup in the tub or shower regularly.  Attach bath mats securely with double-sided non-slip rug tape.  Do not have throw rugs and other things on the floor that can make you trip. What can I do in the bedroom?  Use night lights.  Make sure that you have a light by your bed that is easy to reach.  Do not use any sheets or blankets that are too big for your bed. They should not hang down onto the floor.  Have a firm chair that has side arms. You can use this for support while you get dressed.  Do not have throw rugs and other things on the floor that can make you trip. What can I do in the kitchen?  Clean up any spills right away.  Avoid walking on wet floors.  Keep items that you use a lot in easy-to-reach places.  If you need to reach something above you, use a strong step stool that has a grab bar.  Keep electrical cords out of the way.  Do not use floor polish or wax that makes floors slippery. If you must use wax, use non-skid floor wax.  Do not have  throw rugs and other things on the floor that can make you trip. What can I do with my stairs?  Do not leave any items on the stairs.  Make sure that there are handrails on both sides of the stairs and use them. Fix handrails that are broken or loose. Make sure that handrails are as long as the stairways.  Check any carpeting to make sure that it is firmly attached to the stairs. Fix any carpet that is loose or worn.  Avoid having throw rugs at the top or bottom of the stairs. If you do have throw rugs, attach them to the floor with carpet tape.  Make sure that you have a light switch at the top of the stairs and the bottom of the stairs. If you do not have them, ask someone to add them for you. What else can I do to help prevent falls?  Wear shoes that:  Do not have high heels.  Have rubber bottoms.  Are comfortable and fit you well.  Are closed at the toe. Do not wear sandals.  If you use a  stepladder:  Make sure that it is fully opened. Do not climb a closed stepladder.  Make sure that both sides of the stepladder are locked into place.  Ask someone to hold it for you, if possible.  Clearly mark and make sure that you can see:  Any grab bars or handrails.  First and last steps.  Where the edge of each step is.  Use tools that help you move around (mobility aids) if they are needed. These include:  Canes.  Walkers.  Scooters.  Crutches.  Turn on the lights when you go into a dark area. Replace any light bulbs as soon as they burn out.  Set up your furniture so you have a clear path. Avoid moving your furniture around.  If any of your floors are uneven, fix them.  If there are any pets around you, be aware of where they are.  Review your medicines with your doctor. Some medicines can make you feel dizzy. This can increase your chance of falling. Ask your doctor what other things that you can do to help prevent falls. This information is not intended to replace advice given to you by your health care provider. Make sure you discuss any questions you have with your health care provider. Document Released: 10/29/2008 Document Revised: 06/10/2015 Document Reviewed: 02/06/2014 Elsevier Interactive Patient Education  2017 [employer].

## 2018-12-18 NOTE — Progress Notes (Signed)
Subjective:   Kristina Wagner is a 84 y.o. female who presents for Medicare Annual (Subsequent) preventive examination.  Virtual Visit via Telephone Note  I connected with Kristina Wagner on 12/18/18 at  1:20 PM EST by telephone and verified that I am speaking with the correct person using two identifiers.  Medicare Annual Wellness visit completed telephonically due to Covid-19 pandemic.   Location: Patient: home Provider: office   I discussed the limitations, risks, security and privacy concerns of performing an evaluation and management service by telephone and the availability of in person appointments. The patient expressed understanding and agreed to proceed.  Some vital signs may be absent or patient reported.   Reather Littler, LPN   Review of Systems:   Cardiac Risk Factors include: advanced age (>46men, >40 women);diabetes mellitus;hypertension;sedentary lifestyle     Objective:     Vitals: There were no vitals taken for this visit.  There is no height or weight on file to calculate BMI.  Advanced Directives 10/17/2017 09/21/2017 09/12/2017 05/08/2017 02/16/2017 08/14/2016 10/22/2014  Does Patient Have a Medical Advance Directive? Yes Yes Yes Yes No Yes No  Type of Estate agent of [place];Living will Healthcare Power of [place];Living will Healthcare Power of [place];Living will - - Healthcare Power of [place];Living will -  Does patient want to make changes to medical advance directive? - - No - Patient declined - - - -  Copy of Healthcare Power of Attorney in Chart? Yes - No - copy requested - - No - copy requested -  Would patient like information on creating a medical advance directive? - - - - No - Patient declined - No - patient declined information    Tobacco Social History   Tobacco Use  Smoking Status Former Smoker  . Packs/day: 1.00  . Years: 15.00  . Pack years: 15.00  . Types: Cigarettes  . Quit date: 01/17/1983  . Years since  quitting: 35.9  Smokeless Tobacco Never Used     Counseling given: Not Answered   Clinical Intake:  Pre-visit preparation completed: Yes  Pain : No/denies pain     Nutritional Risks: None Diabetes: Yes CBG done?: No Did pt. bring in CBG monitor from home?: No  How often do you need to have someone help you when you read instructions, pamphlets, or other written materials from your doctor or pharmacy?: 1 - Never  Interpreter Needed?: No  Information entered by :: Reather Littler LPN  Past Medical History:  Diagnosis Date  . Allergy   . Anxiety   . Arthritis    ankles, knees  . Atrial fibrillation (HCC)   . CHF (congestive heart failure) (HCC)    pt denies  . Depression   . DM (diabetes mellitus), type 2 (HCC)   . GERD (gastroesophageal reflux disease)   . HOH (hard of hearing)   . Hyperlipidemia   . Hypertension   . Thyroid nodule   . Wears dentures    Partial lower, full upper   Past Surgical History:  Procedure Laterality Date  . CATARACT EXTRACTION W/PHACO Left 09/12/2017   Procedure: CATARACT EXTRACTION PHACO AND INTRAOCULAR LENS PLACEMENT (IOC) LEFT DIABETES;  Surgeon: Lockie Mola, MD;  [facility];  Service: Ophthalmology;  Laterality: Left;  Diabetic - oral meds  . COLONOSCOPY  2006   had 2 polyps- benign   Family History  Problem Relation Age of Onset  . Colon cancer Father   . Heart disease Brother   .  Diabetes Sister    Social History   Socioeconomic History  . Marital status: Widowed    Spouse name: Not on file  . Number of children: 2  . Years of education: Not on file  . Highest education level: Not on file  Occupational History  . Not on file  Social Needs  . Financial resource strain: Not hard at all  . Food insecurity    Worry: Never true    Inability: Never true  . Transportation needs    Medical: No    Non-medical: No  Tobacco Use  . Smoking status: Former Smoker    Packs/day: 1.00    Years: 15.00     Pack years: 15.00    Types: Cigarettes    Quit date: 01/17/1983    Years since quitting: 35.9  . Smokeless tobacco: Never Used  Substance and Sexual Activity  . Alcohol use: No    Alcohol/week: 0.0 standard drinks  . Drug use: No  . Sexual activity: Never  Lifestyle  . Physical activity    Days per week: 0 days    Minutes per session: 0 min  . Stress: To some extent  Relationships  . Social Herbalist on phone: Patient refused    Gets together: Patient refused    Attends religious service: Patient refused    Active member of club or organization: Patient refused    Attends meetings of clubs or organizations: Patient refused    Relationship status: Widowed  Other Topics Concern  . Not on file  Social History Narrative  . Not on file    Outpatient Encounter Medications as of 12/18/2018  Medication Sig  . ALPRAZolam (XANAX) 0.25 MG tablet Take 0.25 mg by mouth 2 (two) times daily. [facility]  . Cyanocobalamin (VITAMIN B12 PO) Take 1 tablet by mouth daily.   Marland Kitchen diltiazem (CARDIZEM CD) 120 MG 24 hr capsule Take 1 capsule (120 mg total) by mouth daily.  . furosemide (LASIX) 20 MG tablet Take 1 tablet (20 mg total) by mouth daily as needed.  Marland Kitchen glipiZIDE (GLUCOTROL) 10 MG tablet Take 1 tablet (10 mg total) by mouth 2 (two) times daily before a meal.  . glucose blood (ONETOUCH VERIO) test strip USE 1 STRIP TO CHECK GLUCOSE ONCE DAILY  . hydrALAZINE (APRESOLINE) 25 MG tablet Take 1 tablet (25 mg total) by mouth 2 (two) times daily.  . lansoprazole (PREVACID) 15 MG capsule Take 1 capsule (15 mg total) by mouth daily as needed.  . metFORMIN (GLUCOPHAGE-XR) 500 MG 24 hr tablet Take 1 tablet by mouth once daily  . metoprolol tartrate (LOPRESSOR) 100 MG tablet Take 1 tablet (100 mg total) by mouth 2 (two) times daily.  . Multiple Vitamins-Minerals (MULTIVITAL PO) Take 1 tablet by mouth daily.  . Omega-3 Fatty Acids (FISH OIL) 1200 MG CAPS Take 1 capsule (1,200 mg total) by mouth daily.  .  rivaroxaban (XARELTO) 20 MG TABS tablet Take 1 tablet by mouth daily. Dr Posey Pronto  . simvastatin (ZOCOR) 40 MG tablet Take 1 tablet (40 mg total) by mouth daily.  . valsartan (DIOVAN) 320 MG tablet TAKE 1 TABLET BY MOUTH EVERY DAY  . venlafaxine XR (EFFEXOR-XR) 150 MG 24 hr capsule Take 1 capsule (150 mg total) by mouth daily with breakfast.  . [DISCONTINUED] Apoaequorin (PREVAGEN PO) Take 1 tablet by mouth daily.   . [DISCONTINUED] ARIPiprazole (ABILIFY) 2 MG tablet Take 1 mg by mouth daily.   . [DISCONTINUED] diclofenac sodium (VOLTAREN) 1 %  GEL Apply topically.   No facility-administered encounter medications on file as of 12/18/2018.     Activities of Daily Living In your present state of health, do you have any difficulty performing the following activities: 12/18/2018  Hearing? Y  Comment wears hearing aids  Vision? N  Difficulty concentrating or making decisions? N  Walking or climbing stairs? Y  Dressing or bathing? N  Doing errands, shopping? N  Preparing Food and eating ? N  Using the Toilet? N  In the past six months, have you accidently leaked urine? Y  Comment wears pads/depends for protection  Do you have problems with loss of bowel control? N  Managing your Medications? N  Managing your Finances? N  Housekeeping or managing your Housekeeping? Y  Comment has housekeeper come every other week  Some recent data might be hidden    Patient Care Team: Duanne Limerick, MD as PCP - General (Family Medicine)    Assessment:   This is a routine wellness examination for [place].  Exercise Activities and Dietary recommendations Current Exercise Habits: The patient does not participate in regular exercise at present, Exercise limited by: orthopedic condition(s)  Goals    . Increase water intake     Recommend drinking at least 5-6 glasses of water a day        Fall Risk Fall Risk  12/18/2018 12/06/2017 11/08/2017 10/11/2017 09/21/2017  Falls in the past year? 0 (No Data) No  (No Data) No  Comment - no falls since previous visit - no falls since previous visit -  Number falls in past yr: 0 - - - -  Injury with Fall? 0 - - - -  Risk for fall due to : Impaired balance/gait;Orthopedic patient - - - -  Follow up Falls prevention discussed - - - -   FALL RISK PREVENTION PERTAINING TO THE HOME:  Any stairs in or around the home? Yes  If so, do they handrails? Yes   Home free of loose throw rugs in walkways, pet beds, electrical cords, etc? Yes  Adequate lighting in your home to reduce risk of falls? Yes   ASSISTIVE DEVICES UTILIZED TO PREVENT FALLS:  Life alert? Yes  Use of a cane, walker or w/c? Yes  Grab bars in the bathroom? Yes  Shower chair or bench in shower? Yes Elevated toilet seat or a handicapped toilet? Yes   DME ORDERS:  DME order needed?  No   TIMED UP AND GO:  Was the test performed? No . Telephonic visit. .   Education: Fall risk prevention has been discussed.  Intervention(s) required? No   Depression Screen PHQ 2/9 Scores 12/18/2018 07/11/2018 05/17/2018 01/29/2018  PHQ - 2 Score 6 0 5 0  PHQ- 9 Score 8 0 9 0     Cognitive Function     6CIT Screen 12/18/2018 08/14/2016  What Year? 0 points 0 points  What month? 0 points 0 points  What time? 0 points 0 points  Count back from 20 0 points 0 points  Months in reverse 2 points 0 points  Repeat phrase 2 points 2 points  Total Score 4 2    Immunization History  Administered Date(s) Administered  . Influenza, High Dose Seasonal PF 10/19/2016, 11/24/2017, 11/18/2018  . Influenza,inj,Quad PF,6+ Mos 10/22/2014  . Influenza-Unspecified 10/30/2013, 11/24/2017, 11/18/2018  . Pneumococcal Conjugate-13 10/30/2013, 10/22/2014  . Pneumococcal Polysaccharide-23 09/19/2012  . Zoster Recombinat (Shingrix) 11/18/2018    Qualifies for Shingles Vaccine? Yes  1st dose of  shingrix completed.   Tdap: Although this vaccine is not a covered service during a Wellness Exam, does the patient still  wish to receive this vaccine today?  No .  Education has been provided regarding the importance of this vaccine. Advised may receive this vaccine at local pharmacy or Health Dept. Aware to provide a copy of the vaccination record if obtained from local pharmacy or Health Dept. Verbalized acceptance and understanding.  Flu Vaccine: Up to date  Pneumococcal Vaccine: Up to date   Screening Tests Health Maintenance  Topic Date Due  . FOOT EXAM  06/29/1944  . OPHTHALMOLOGY EXAM  05/25/2018  . DEXA SCAN  01/30/2019 (Originally 06/28/2001)  . TETANUS/TDAP  01/30/2019 (Originally 06/29/1953)  . HEMOGLOBIN A1C  06/04/2019  . INFLUENZA VACCINE  Completed  . PNA vac Low Risk Adult  Completed    Cancer Screenings:  Colorectal Screening:  No longer required.  Mammogram: Completed 02/24/15.  No longer required.   Bone Density: Not completed. Pt declined.   Lung Cancer Screening: (Low Dose CT Chest recommended if Age 73-80 years, 30 pack-year currently smoking OR have quit w/in 15years.) does not qualify.    Additional Screening:  Hepatitis C Screening: no longer required  Vision Screening: Recommended annual ophthalmology exams for early detection of glaucoma and other disorders of the eye. Is the patient up to date with their annual eye exam?  No  Who is the provider or what is the [facility] of the [facility] which the pt [facility]  Dental Screening: Recommended annual dental exams for proper oral hygiene  Community Resource Referral:  CRR required this visit?  No        Plan:     I have personally reviewed and addressed the Medicare Annual Wellness questionnaire and have noted the following in the patient's chart:  A. Medical and social history B. Use of alcohol, tobacco or illicit drugs  C. Current medications and supplements D. Functional ability and status E.  Nutritional status F.  Physical activity G. Advance directives H. List of other  physicians I.  Hospitalizations, surgeries, and ER visits in previous 12 months J.  Vitals K. Screenings such as hearing and vision if needed, cognitive and depression L. Referrals and appointments   In addition, I have reviewed and discussed with patient certain preventive protocols, quality metrics, and best practice recommendations. A written personalized care plan for preventive services as well as general preventive health recommendations were provided to patient.   Signed,  Reather LittlerKasey Jeptha Hinnenkamp, LPN Nurse Health Advisor   Nurse Notes: pt feeling down lately due to holidays and being alone. She is in the process of putting her house up for sale [place]. She also has difficulty getting a lot done around her home and inquired about home services. Phone # provided for [facility] and [facility] for prescription delivery.

## 2018-12-20 ENCOUNTER — Telehealth: Payer: Self-pay

## 2018-12-20 ENCOUNTER — Other Ambulatory Visit: Payer: Self-pay

## 2018-12-20 DIAGNOSIS — I10 Essential (primary) hypertension: Secondary | ICD-10-CM

## 2018-12-20 MED ORDER — VALSARTAN 320 MG PO TABS: 320.0000 mg | ORAL_TABLET | Freq: Every day | ORAL | 0 refills | Status: DC

## 2018-12-20 NOTE — Telephone Encounter (Signed)
Pt called in needing a refill on her valsartan. I could see that we only sent in a 30 day supply in August. Therefore I asked pt if she had someone else fill medicine because the dates are not adding up. She shouldv'e ran out in September. Looking in her chart, she saw a cardiologist in Sept who stopped 2 meds and continued her valsartan with a follow up sched for Feb. I put in a call to [facility]/ Dr Patel/ Armandina Gemma to call in med to [place] for a 90 day supply. I also went ahead and sent in a 30 day supply of the med to [facility] [facility] in case she runs out over the weekend and [facility] doesn't get to it. Also called in lancets and explained to pt she only needs to test once a day due to insurance, as she has been testing multiple times per day.

## 2018-12-23 ENCOUNTER — Ambulatory Visit: Payer: Medicare Other | Admitting: Family Medicine

## 2018-12-31 ENCOUNTER — Other Ambulatory Visit: Payer: Self-pay

## 2018-12-31 DIAGNOSIS — E119 Type 2 diabetes mellitus without complications: Secondary | ICD-10-CM

## 2018-12-31 DIAGNOSIS — I10 Essential (primary) hypertension: Secondary | ICD-10-CM

## 2018-12-31 MED ORDER — VALSARTAN 320 MG PO TABS: 320.0000 mg | ORAL_TABLET | Freq: Every day | ORAL | 0 refills | Status: DC

## 2018-12-31 MED ORDER — GLIPIZIDE 10 MG PO TABS: 10.0000 mg | ORAL_TABLET | Freq: Two times a day (BID) | ORAL | 1 refills | Status: DC

## 2018-12-31 MED ORDER — METFORMIN HCL ER 500 MG PO TB24: 500.0000 mg | ORAL_TABLET | Freq: Every day | ORAL | 1 refills | Status: AC

## 2019-01-02 ENCOUNTER — Other Ambulatory Visit: Payer: Self-pay

## 2019-01-02 DIAGNOSIS — I10 Essential (primary) hypertension: Secondary | ICD-10-CM

## 2019-01-02 MED ORDER — FUROSEMIDE 20 MG PO TABS: 20.0000 mg | ORAL_TABLET | Freq: Every day | ORAL | 0 refills | Status: AC | PRN

## 2019-01-02 NOTE — Progress Notes (Unsigned)
Sent in Furosemide to pharmacy

## 2019-02-15 ENCOUNTER — Emergency Department: Payer: Medicare Other

## 2019-02-15 ENCOUNTER — Emergency Department
Admission: EM | Admit: 2019-02-15 | Discharge: 2019-02-16 | Disposition: A | Discharge status: Home / Self Care | Payer: Medicare Other | Attending: Emergency Medicine | Admitting: Emergency Medicine

## 2019-02-15 ENCOUNTER — Other Ambulatory Visit: Payer: Self-pay

## 2019-02-15 ENCOUNTER — Encounter: Payer: Self-pay | Admitting: Intensive Care

## 2019-02-15 DIAGNOSIS — F3341 Major depressive disorder, recurrent, in partial remission: Secondary | ICD-10-CM | POA: Diagnosis present

## 2019-02-15 DIAGNOSIS — R1013 Epigastric pain: Secondary | ICD-10-CM | POA: Insufficient documentation

## 2019-02-15 DIAGNOSIS — R109 Unspecified abdominal pain: Secondary | ICD-10-CM

## 2019-02-15 DIAGNOSIS — F322 Major depressive disorder, single episode, severe without psychotic features: Secondary | ICD-10-CM | POA: Diagnosis not present

## 2019-02-15 DIAGNOSIS — R079 Chest pain, unspecified: Secondary | ICD-10-CM | POA: Insufficient documentation

## 2019-02-15 DIAGNOSIS — I5030 Unspecified diastolic (congestive) heart failure: Secondary | ICD-10-CM | POA: Insufficient documentation

## 2019-02-15 DIAGNOSIS — Z87891 Personal history of nicotine dependence: Secondary | ICD-10-CM | POA: Insufficient documentation

## 2019-02-15 DIAGNOSIS — I110 Hypertensive heart disease with heart failure: Secondary | ICD-10-CM | POA: Diagnosis not present

## 2019-02-15 DIAGNOSIS — F329 Major depressive disorder, single episode, unspecified: Secondary | ICD-10-CM | POA: Diagnosis not present

## 2019-02-15 DIAGNOSIS — Z20822 Contact with and (suspected) exposure to covid-19: Secondary | ICD-10-CM | POA: Insufficient documentation

## 2019-02-15 DIAGNOSIS — F32A Depression, unspecified: Secondary | ICD-10-CM

## 2019-02-15 DIAGNOSIS — Z79899 Other long term (current) drug therapy: Secondary | ICD-10-CM | POA: Diagnosis not present

## 2019-02-15 DIAGNOSIS — Z7984 Long term (current) use of oral hypoglycemic drugs: Secondary | ICD-10-CM | POA: Diagnosis not present

## 2019-02-15 DIAGNOSIS — F332 Major depressive disorder, recurrent severe without psychotic features: Secondary | ICD-10-CM | POA: Diagnosis not present

## 2019-02-15 LAB — LIPASE, BLOOD: Lipase: 24 U/L (ref 11–51)

## 2019-02-15 LAB — CBC
HCT: 39.5 % (ref 36.0–46.0)
Hemoglobin: 12.9 g/dL (ref 12.0–15.0)
MCH: 29.6 pg (ref 26.0–34.0)
MCHC: 32.7 g/dL (ref 30.0–36.0)
MCV: 90.6 fL (ref 80.0–100.0)
Platelets: 251 10*3/uL (ref 150–400)
RBC: 4.36 MIL/uL (ref 3.87–5.11)
RDW: 12.8 % (ref 11.5–15.5)
WBC: 7.8 10*3/uL (ref 4.0–10.5)
nRBC: 0 % (ref 0.0–0.2)

## 2019-02-15 LAB — BASIC METABOLIC PANEL
Anion gap: 11 (ref 5–15)
BUN: 9 mg/dL (ref 8–23)
CO2: 25 mmol/L (ref 22–32)
Calcium: 9.4 mg/dL (ref 8.9–10.3)
Chloride: 102 mmol/L (ref 98–111)
Creatinine, Ser: 0.77 mg/dL (ref 0.44–1.00)
GFR calc Af Amer: >60 mL/min (ref >60)
GFR calc non Af Amer: >60 mL/min (ref >60)
Glucose, Bld: 166 mg/dL — ABNORMAL HIGH (ref 70–99)
Potassium: 3.7 mmol/L (ref 3.5–5.1)
Sodium: 138 mmol/L (ref 135–145)

## 2019-02-15 LAB — HEPATIC FUNCTION PANEL
ALT: 17 U/L (ref 0–44)
AST: 19 U/L (ref 15–41)
Albumin: 3.9 g/dL (ref 3.5–5.0)
Alkaline Phosphatase: 74 U/L (ref 38–126)
Bilirubin, Direct: 0.1 mg/dL (ref 0.0–0.2)
Indirect Bilirubin: 0.5 mg/dL (ref 0.3–0.9)
Total Bilirubin: 0.6 mg/dL (ref 0.3–1.2)
Total Protein: 7.8 g/dL (ref 6.5–8.1)

## 2019-02-15 LAB — RESPIRATORY PANEL BY RT PCR (FLU A&B, COVID)
Influenza A by PCR: NEGATIVE
Influenza B by PCR: NEGATIVE
SARS Coronavirus 2 by RT PCR: NEGATIVE

## 2019-02-15 LAB — TROPONIN I (HIGH SENSITIVITY): Troponin I (High Sensitivity): 8 ng/L (ref <18)

## 2019-02-15 NOTE — ED Notes (Signed)
Medical POA updated on plan of care.   Corena Herter [phone]

## 2019-02-15 NOTE — ED Notes (Signed)
Pt c/o feeling depressed and having no support system at home.

## 2019-02-15 NOTE — ED Notes (Signed)
Pt given sandwich tray and ice water 

## 2019-02-15 NOTE — ED Notes (Signed)
Pt given warm blanket, call bell within reach, stretcher locked in lowest position.

## 2019-02-15 NOTE — ED Notes (Signed)
Pt to ED c/o CP and mid abdominal pain that started a couple weeks ago. Denies N/V/D, fevers, SHOB or dizziness.  Pt in NAD at this time.  Hx of acid reflux.  Denies CP or abdominal pain at this time

## 2019-02-15 NOTE — BH Assessment (Signed)
Assessment Note  Kristina Wagner is an 85 y.o. female presenting [facility] ED voluntarily for intermittent abdominal pain and upset stomach for 3 weeks. Patient's nurse documented that patient reported having feelings of depression and no support system at home and EDP reported that patient has been experiencing worsening depression since her husband died 3 years ago and both of her dogs have died over the past year.States she lives alone she is seeing someone at [facility] and they have decreased her anxiety medication, states she cannot sleep because her thoughts are running through her head.  States she did not sleep at all last night. During assessment patient was alert and oriented x4, pleasant and cooperative. Patient reported "when I do sleep it's like my mind has been going ."  Patient continued to endorsee depression symptoms "I don't eat, I don't get off the couch, I'll sit in my chair all day and do nothing, I don't shower, I'll shower maybe once a week, I had to hire a person to come to my house to clean it because I just don't have the motivation to do it, I'll pay my bills but I don't want to." Patient reported "I'm so down all the time, nothing makes me happy." Patient reported that she is engaged with outpatient services at [facility] and sees her psychiatrist Dr. Flavia Shipper "once every 3 months." Patient reported that although she is prescribed Abilify she does not take it "it makes me sleep too much." Patient denies current SI/HI/AH/VH and does not appear to be responding to any internal or external stimuli.   Per Psyc NP patient is recommended for Inpatient Hospitalization.   Diagnosis: Major Depressive Disorder, Severe  Past Medical History:  Past Medical History:  Diagnosis Date  . Allergy   . Anxiety   . Arthritis    ankles, knees  . Atrial fibrillation (HCC)   . CHF (congestive heart failure) (HCC)    pt denies  . Depression   . DM (diabetes mellitus), type 2 (HCC)   . GERD  (gastroesophageal reflux disease)   . HOH (hard of hearing)   . Hyperlipidemia   . Hypertension   . Thyroid nodule   . Wears dentures    Partial lower, full upper    Past Surgical History:  Procedure Laterality Date  . CATARACT EXTRACTION W/PHACO Left 09/12/2017   Procedure: CATARACT EXTRACTION PHACO AND INTRAOCULAR LENS PLACEMENT (IOC) LEFT DIABETES;  Surgeon: Lockie Mola, MD;  Location: [facility];  Service: Ophthalmology;  Laterality: Left;  Diabetic - oral meds  . COLONOSCOPY  2006   had 2 polyps- benign    Family History:  Family History  Problem Relation Age of Onset  . Colon cancer Father   . Heart disease Brother   . Diabetes Sister     Social History:  reports that she quit smoking about 36 years ago. Her smoking use included cigarettes. She has a 15.00 pack-year smoking history. She has never used smokeless tobacco. She reports that she does not drink alcohol or use drugs.  Additional Social History:  Alcohol / Drug Use Pain Medications: See MAR Prescriptions: See MAR Over the Counter: See MAR History of alcohol / drug use?: No history of alcohol / drug abuse  CIWA: CIWA-Ar BP: (!) 157/82 Pulse Rate: (!) 50 COWS:    Allergies: No Known Allergies  Home Medications: (Not in a hospital admission)   OB/GYN Status:  No LMP recorded. Patient is postmenopausal.  General Assessment Data Location of Assessment: [facility]  ED TTS Assessment: In system Is this a Tele or Face-to-Face Assessment?: Face-to-Face Is this an Initial Assessment or a Re-assessment for this encounter?: Initial Assessment Patient Accompanied by:: N/A Language Other than English: No Living Arrangements: Other (Comment)(Private Residence) What gender do you identify as?: Female Marital status: Widowed Living Arrangements: Alone Can pt return to current living arrangement?: Yes Admission Status: Voluntary Is patient capable of signing voluntary admission?: Yes Referral Source:  Self/Family/Friend Insurance type: Probation officer [employer] Screening Exam [facility] Walk-in ONLY) Medical Exam completed: Yes  Crisis Care Plan Living Arrangements: Alone Legal Guardian: Other:(Self) Name of Psychiatrist: Dr. Flavia Shipper ([facility]) Name of Therapist: None  Education Status Is patient currently in school?: No Is the patient employed, unemployed or receiving disability?: (Retired)  Risk to self with the past 6 months Suicidal Ideation: No Has patient been a risk to self within the past 6 months prior to admission? : No Suicidal Intent: No Has patient had any suicidal intent within the past 6 months prior to admission? : No Is patient at risk for suicide?: No Suicidal Plan?: No Has patient had any suicidal plan within the past 6 months prior to admission? : No Access to Means: No What has been your use of drugs/alcohol within the last 12 months?: None Previous Attempts/Gestures: No Intentional Self Injurious Behavior: None Family Suicide History: No Recent stressful life event(s): Other (Comment)(Husband passed away) Persecutory voices/beliefs?: No Depression: Yes Depression Symptoms: Insomnia, Tearfulness, Loss of interest in usual pleasures, Fatigue, Feeling worthless/self pity, Isolating Substance abuse history and/or treatment for substance abuse?: No Suicide prevention information given to non-admitted patients: Not applicable  Risk to Others within the past 6 months Homicidal Ideation: No Does patient have any lifetime risk of violence toward others beyond the six months prior to admission? : No Thoughts of Harm to Others: No Current Homicidal Intent: No Current Homicidal Plan: No Access to Homicidal Means: No History of harm to others?: No Assessment of Violence: None Noted Does patient have access to weapons?: No Criminal Charges Pending?: No Does patient have a court date: No Is patient on probation?: No  Psychosis Hallucinations: None  noted Delusions: None noted  Mental Status Report Appearance/Hygiene: Unremarkable Eye Contact: Good Motor Activity: Freedom of movement Speech: Logical/coherent Level of Consciousness: Alert Mood: Depressed, Sad Affect: Depressed, Sad Anxiety Level: Minimal Thought Processes: Coherent Judgement: Unimpaired Orientation: Person, Place, Time, Situation, Appropriate for developmental age Obsessive Compulsive Thoughts/Behaviors: None  Cognitive Functioning Concentration: Normal Memory: Recent Intact, Remote Intact Is patient IDD: No Insight: Good Impulse Control: Good Appetite: Poor Have you had any weight changes? : No Change Sleep: No Change Total Hours of Sleep: 1 Vegetative Symptoms: None  ADLScreening [facility] Assessment Services) Patient's cognitive ability adequate to safely complete daily activities?: Yes Patient able to express need for assistance with ADLs?: Yes Independently performs ADLs?: Yes (appropriate for developmental age)  Prior Inpatient Therapy Prior Inpatient Therapy: No  Prior Outpatient Therapy Prior Outpatient Therapy: Yes Prior Therapy Dates: Currently Prior Therapy Facilty/Provider(s): [facility] Reason for Treatment: Depression, Anxiety Does patient have an ACCT team?: No Does patient have Intensive In-House Services?  : No Does patient have [facility] services? : No Does patient have P4CC services?: No  ADL Screening (condition at time of admission) Patient's cognitive ability adequate to safely complete daily activities?: Yes Is the patient deaf or have difficulty hearing?: No Does the patient have difficulty seeing, even when wearing glasses/contacts?: No Does the patient have difficulty concentrating, remembering, or making decisions?:  No Patient able to express need for assistance with ADLs?: Yes Does the patient have difficulty dressing or bathing?: No Independently performs ADLs?: Yes (appropriate for developmental age) Does the patient have  difficulty walking or climbing stairs?: No Weakness of Legs: None Weakness of Arms/Hands: None  Home Assistive Devices/Equipment Home Assistive Devices/Equipment: None  Therapy Consults (therapy consults require a physician order) PT Evaluation Needed: No OT Evalulation Needed: No SLP Evaluation Needed: No Abuse/Neglect Assessment (Assessment to be complete while patient is alone) Abuse/Neglect Assessment Can Be Completed: Yes Physical Abuse: Denies Verbal Abuse: Denies Sexual Abuse: Denies Exploitation of patient/patient's resources: Denies Self-Neglect: Denies Values / Beliefs Cultural Requests During Hospitalization: None Spiritual Requests During Hospitalization: None Consults Spiritual Care Consult Needed: No Transition of Care Team Consult Needed: No Advance Directives (For Healthcare) Does Patient Have a Medical Advance Directive?: Yes Type of Advance Directive: Living will Copy of Living Will in Chart?: No - copy requested          Disposition: Per Psyc NP patient is recommended for Inpatient Hospitalization. Disposition Initial Assessment Completed for this Encounter: Yes  On Site Evaluation by:   Reviewed with Physician:    Leonie Douglas MS [place] 02/15/2019 11:42 PM

## 2019-02-15 NOTE — ED Triage Notes (Signed)
C/o abd pain/burning that radiated up to her chest X2 weeks. Denies N/V/D.

## 2019-02-15 NOTE — BH Assessment (Signed)
Referral information for Psychiatric Hospitalization faxed to;   [facility] [phone]),   . [facility] [phone] -or- [phone]) [phone]  . [facility] [phone]),   . [facility] [phone] or [phone])  . [facility] [phone] or [phone]),    [facility] [phone])

## 2019-02-15 NOTE — ED Notes (Signed)
  Pt dressed out with Alissa, NT. Pt belongings included: crocs, earrings, shirt, pants, underwear, cellphone, jean jacket, wallet, phone charger, pad of papers, socks

## 2019-02-15 NOTE — ED Notes (Signed)
Pt ambulated to restroom, stand by assist with this RN, pt noted to be slightly unsteady on feet

## 2019-02-15 NOTE — ED Triage Notes (Signed)
First RN Note: Pt presents to ED via [facility] from with c/o intermittent abdominal pain and upset stomach x 3 weeks. Per EMS pt denies any pain, no vomiting, no diarrhea at this time, just some mild nausea. Per EMS pt has hx of HTN, DM.   168/88 70 98.8 CBG 199

## 2019-02-15 NOTE — Consult Note (Signed)
[facility]   Reason for Consult:  Psych Evaluation Referring Physician:  Dr. Gean BirchwoodPa Patient Identification: Kristina HumphreysBarbara J Wagner MRN:  [id number] Principal Diagnosis: <principal problem not specified> Diagnosis:  Active Problems:   MDD (major depressive disorder)   Total Time spent with patient: 1 hour  Subjective:   Kristina HumphreysBarbara J Wagner is a 85 y.o. female patient admitted with  c/o feeling depressed and having no support system at home.   HPI:  Per TTS:  Kristina HumphreysBarbara J Wagner is an 85 y.o. female presenting [facility] for intermittent abdominal pain and upset stomach for 3 weeks. Patient's nurse documented that patient reported having feelings of depression and no support system at home and EDP reported that patient has been experiencing worsening depression since her husband died 3 years ago and both of her dogs have died over the past year.States she lives alone she is [facility] and they have decreased her anxiety medication, states she cannot sleep because her thoughts are running through her head. States she did not sleep at all last night. During assessment patient was alert and oriented x4, pleasant and cooperative. Patient reported "when I do sleep it's like my mind has been going 100mph."  Patient continued to endorsee depression symptoms "I don't eat, I don't get off the couch, I'll sit in my chair all day and do nothing, I don't shower, I'll shower maybe once a week, I had to hire a person to come to my house to clean it because I just don't have the motivation to do it, I'll pay my bills but I don't want to." Patient reported "I'm so down all the time, nothing makes me happy." Patient reported that she is engaged [facility] and sees her psychiatrist Dr. Flavia ShipperBrayden "once every 3 months." Patient reported that although she is prescribed Abilify she does not take it "it makes me sleep too much." Patient denies current SI/HI/AH/VH and does  not appear to be responding to any internal or external stimuli.   HPI: Kristina HumphreysBarbara J Wagner, 85 y.o., female patient seen face to face by this provider, Dr. Lenard LancePaduchowski; and chart reviewed on 02/16/19.  On evaluation Kristina HumphreysBarbara J Wagner reports she has been very depressed since her husband and dogs died. Patient reports that she barely moves off of the couch. She states that she thought she felt this way because she is in her 2280s. Writer explained to pt that depression is not a symptom of aging. Pt was receptive and stated she would like help.  During evaluation Kristina HumphreysBarbara J Wagner is alert/oriented x 4; calm/cooperative; and mood is congruent with affect.  She does not appear to be responding to internal/external stimuli or delusional thoughts.  Patient denies suicidal/self-harm/homicidal ideation, psychosis, and paranoia.  Patient answered question appropriately.       Past Psychiatric History: yes (depression)  Risk to Self: Suicidal Ideation: No Suicidal Intent: No Is patient at risk for suicide?: No Suicidal Plan?: No Access to Means: No What has been your use of drugs/alcohol within the last 12 months?: None Intentional Self Injurious Behavior: None Risk to Others: Homicidal Ideation: No Thoughts of Harm to Others: No Current Homicidal Intent: No Current Homicidal Plan: No Access to Homicidal Means: No History of harm to others?: No Assessment of Violence: None Noted Does patient have access to weapons?: No Criminal Charges Pending?: No Does patient have a court date: No Prior Inpatient Therapy: Prior Inpatient Therapy: No Prior Outpatient Therapy: Prior Outpatient  Therapy: Yes Prior Therapy Dates: Currently Prior Therapy Facilty/Provider(s): [facility] Reason for Treatment: Depression, Anxiety Does patient have an ACCT team?: No Does patient have Intensive In-House Services?  : No Does patient have [facility] services? : No Does patient have P4CC services?: No  Past Medical History:   Past Medical History:  Diagnosis Date  . Allergy   . Anxiety   . Arthritis    ankles, knees  . Atrial fibrillation ([place])   . CHF (congestive heart failure) ([place])    pt denies  . Depression   . DM (diabetes mellitus), type 2 ([place])   . GERD (gastroesophageal reflux disease)   . HOH (hard of hearing)   . Hyperlipidemia   . Hypertension   . Thyroid nodule   . Wears dentures    Partial lower, full upper    Past Surgical History:  Procedure Laterality Date  . CATARACT EXTRACTION W/PHACO Left 09/12/2017   Procedure: CATARACT EXTRACTION PHACO AND INTRAOCULAR LENS PLACEMENT ([facility]) LEFT DIABETES;  Surgeon: Leandrew Koyanagi, MD;  Location: [facility];  Service: Ophthalmology;  Laterality: Left;  Diabetic - oral meds  . COLONOSCOPY  2006   had 2 polyps- benign   Family History:  Family History  Problem Relation Age of Onset  . Colon cancer Father   . Heart disease Brother   . Diabetes Sister    Family Psychiatric  History: unknown Social History:  Social History   Substance and Sexual Activity  Alcohol Use No  . Alcohol/week: 0.0 standard drinks     Social History   Substance and Sexual Activity  Drug Use No    Social History   Socioeconomic History  . Marital status: Widowed    Spouse name: Not on file  . Number of children: 2  . Years of education: Not on file  . Highest education level: Not on file  Occupational History  . Not on file  Tobacco Use  . Smoking status: Former Smoker    Packs/day: 1.00    Years: 15.00    Pack years: 15.00    Types: Cigarettes    Quit date: 01/17/1983    Years since quitting: 36.1  . Smokeless tobacco: Never Used  Substance and Sexual Activity  . Alcohol use: No    Alcohol/week: 0.0 standard drinks  . Drug use: No  . Sexual activity: Never  Other Topics Concern  . Not on file  Social History Narrative  . Not on file   Social Determinants of Health   Financial Resource Strain: Low Risk   . Difficulty of Paying  Living Expenses: Not hard at all  Food Insecurity: No Food Insecurity  . Worried About Charity fundraiser in the Last Year: Never true  . Ran Out of Food in the Last Year: Never true  Transportation Needs: No Transportation Needs  . Lack of Transportation (Medical): No  . Lack of Transportation (Non-Medical): No  Physical Activity: Inactive  . Days of Exercise per Week: 0 days  . Minutes of Exercise per Session: 0 min  Stress: Stress Concern Present  . Feeling of Stress : To some extent  Social Connections: Unknown  . Frequency of Communication with Friends and Family: Patient refused  . Frequency of Social Gatherings with Friends and Family: Patient refused  . Attends Religious Services: Patient refused  . Active Member of Clubs or Organizations: Patient refused  . Attends Archivist Meetings: Patient refused  . Marital Status: Widowed   Additional Social History:  Allergies:  No Known Allergies  Labs:  Results for orders placed or performed during the hospital encounter of 02/15/19 (from the past 48 hour(s))  Basic metabolic panel     Status: Abnormal   Collection Time: 02/15/19  3:27 PM  Result Value Ref Range   Sodium 138 135 - 145 mmol/L   Potassium 3.7 3.5 - 5.1 mmol/L   Chloride 102 98 - 111 mmol/L   CO2 25 22 - 32 mmol/L   Glucose, Bld 166 (H) 70 - 99 mg/dL   BUN 9 8 - 23 mg/dL   Creatinine, Ser 2.63 0.44 - 1.00 mg/dL   Calcium 9.4 8.9 - 78.5 mg/dL   GFR calc non Af Amer >60 >60 mL/min   GFR calc Af Amer >60 >60 mL/min   Anion gap 11 5 - 15    Comment: Performed at [facility], [address] [place], [place] [address]  CBC     Status: None   Collection Time: 02/15/19  3:27 PM  Result Value Ref Range   WBC 7.8 4.0 - 10.5 K/uL   RBC 4.36 3.87 - 5.11 MIL/uL   Hemoglobin 12.9 12.0 - 15.0 g/dL   HCT 77.4 12.8 - 78.6 %   MCV 90.6 80.0 - 100.0 fL   MCH 29.6 26.0 - 34.0 pg   MCHC 32.7 30.0 - 36.0 g/dL   RDW 76.7 20.9 - 47.0 %   Platelets  251 150 - 400 K/uL   nRBC 0.0 0.0 - 0.2 %    Comment: Performed at [facility], [address], [place], [place] [address]  Troponin I (High Sensitivity)     Status: None   Collection Time: 02/15/19  3:27 PM  Result Value Ref Range   Troponin I (High Sensitivity) 8 <18 ng/L    Comment: (NOTE) Elevated high sensitivity troponin I (hsTnI) values and significant  changes across serial measurements may suggest ACS but many other  chronic and acute conditions are known to elevate hsTnI results.  Refer to the "Links" section for chest pain algorithms and additional  guidance. Performed at [facility], [address] [place], [place] [address]   Hepatic function panel     Status: None   Collection Time: 02/15/19  3:27 PM  Result Value Ref Range   Total Protein 7.8 6.5 - 8.1 g/dL   Albumin 3.9 3.5 - 5.0 g/dL   AST 19 15 - 41 U/L   ALT 17 0 - 44 U/L   Alkaline Phosphatase 74 38 - 126 U/L   Total Bilirubin 0.6 0.3 - 1.2 mg/dL   Bilirubin, Direct 0.1 0.0 - 0.2 mg/dL   Indirect Bilirubin 0.5 0.3 - 0.9 mg/dL    Comment: Performed at [facility], [address] [place], [place] [address]  Lipase, blood     Status: None   Collection Time: 02/15/19  3:27 PM  Result Value Ref Range   Lipase 24 11 - 51 U/L    Comment: Performed at [facility], [address] [place], [place] [address]  Respiratory Panel by RT PCR (Flu A&B, Covid) - Nasopharyngeal Swab     Status: None   Collection Time: 02/15/19 10:13 PM   Specimen: Nasopharyngeal Swab  Result Value Ref Range   SARS Coronavirus 2 by RT PCR NEGATIVE NEGATIVE    Comment: (NOTE) SARS-CoV-2 target nucleic acids are NOT DETECTED. The SARS-CoV-2 RNA is generally detectable in upper respiratoy specimens during the acute phase of infection. The  lowest concentration of SARS-CoV-2 viral copies this assay can detect is 131 copies/mL. A negative result does not preclude SARS-Cov-2 infection and should not  be used as the sole basis for treatment or other patient management decisions. A negative result may occur with  improper specimen collection/handling, submission of specimen other than nasopharyngeal swab, presence of viral mutation(s) within the areas targeted by this assay, and inadequate number of viral copies (<131 copies/mL). A negative result must be combined with clinical observations, patient history, and epidemiological information. The expected result is Negative. Fact Sheet for Patients:  https://www.moore.com/ Fact Sheet for Healthcare Providers:  https://www.young.biz/ This test is not yet ap proved or cleared by the [place] FDA and  has been authorized for detection and/or diagnosis of SARS-CoV-2 by FDA under an Emergency Use Authorization (EUA). This EUA will remain  in effect (meaning this test can be used) for the duration of the COVID-19 declaration under Section 564(b)(1) of the Act, 21 U.S.C. section 360bbb-3(b)(1), unless the authorization is terminated or revoked sooner.    Influenza A by PCR NEGATIVE NEGATIVE   Influenza B by PCR NEGATIVE NEGATIVE    Comment: (NOTE) The Xpert Xpress SARS-CoV-2/FLU/RSV assay is intended as an aid in  the diagnosis of influenza from Nasopharyngeal swab specimens and  should not be used as a sole basis for treatment. Nasal washings and  aspirates are unacceptable for Xpert Xpress SARS-CoV-2/FLU/RSV  testing. Fact Sheet for Patients: https://www.moore.com/ Fact Sheet for Healthcare Providers: https://www.young.biz/ This test is not yet approved or cleared by the [place] FDA and  has been authorized for detection and/or diagnosis of SARS-CoV-2 by  FDA under an Emergency Use Authorization (EUA). This EUA will remain  in effect (meaning this test can be used) for the duration of the  Covid-19 declaration under Section 564(b)(1) of the Act, 21   U.S.C. section 360bbb-3(b)(1), unless the authorization is  terminated or revoked. Performed at [facility], [address] [place], [place] [address]     No current facility-administered medications for this encounter.   Current Outpatient Medications  Medication Sig Dispense Refill  . ALPRAZolam (XANAX) 0.25 MG tablet Take 0.25 mg by mouth 2 (two) times daily. [facility]    . Cyanocobalamin (VITAMIN B12 PO) Take 1 tablet by mouth daily.     Marland Kitchen diltiazem (CARDIZEM CD) 120 MG 24 hr capsule Take 1 capsule (120 mg total) by mouth daily. 90 capsule 0  . furosemide (LASIX) 20 MG tablet Take 1 tablet (20 mg total) by mouth daily as needed. (Patient taking differently: Take 20 mg by mouth daily. ) 90 tablet 0  . glipiZIDE (GLUCOTROL) 10 MG tablet Take 1 tablet (10 mg total) by mouth 2 (two) times daily before a meal. 60 tablet 1  . glucose blood (ONETOUCH VERIO) test strip USE 1 STRIP TO CHECK GLUCOSE ONCE DAILY 50 each 2  . hydrALAZINE (APRESOLINE) 25 MG tablet Take 1 tablet (25 mg total) by mouth 2 (two) times daily. 180 tablet 1  . lansoprazole (PREVACID) 15 MG capsule Take 1 capsule (15 mg total) by mouth daily as needed. (Patient taking differently: Take 15 mg by mouth daily. ) 90 capsule 1  . metFORMIN (GLUCOPHAGE-XR) 500 MG 24 hr tablet Take 1 tablet (500 mg total) by mouth daily. 30 tablet 1  . metoprolol tartrate (LOPRESSOR) 100 MG tablet Take 1 tablet (100 mg total) by mouth 2 (two) times daily. 180 tablet 1  . Multiple Vitamins-Minerals (MULTIVITAL PO) Take 1 tablet by  mouth daily.    . Omega-3 Fatty Acids (FISH OIL) 1200 MG CAPS Take 1 capsule (1,200 mg total) by mouth daily. 30 capsule 11  . rivaroxaban (XARELTO) 20 MG TABS tablet Take 1 tablet by mouth daily. Dr Allena Katz    . simvastatin (ZOCOR) 40 MG tablet Take 1 tablet (40 mg total) by mouth daily. 90 tablet 1  . valsartan (DIOVAN) 320 MG tablet Take 1 tablet (320 mg total) by mouth daily. 90 tablet 0  . venlafaxine XR  (EFFEXOR-XR) 150 MG 24 hr capsule Take 1 capsule (150 mg total) by mouth daily with breakfast. 90 capsule 1    Musculoskeletal: Strength & Muscle Tone: within normal limits and Leg is a little unstable due to past surgery Gait & Station: unsteady Patient leans: N/A  Psychiatric Specialty Exam: Physical Exam  Nursing note and vitals reviewed. Constitutional: She is oriented to person, place, and time. She appears well-developed.  HENT:  Head: Normocephalic.  Eyes: Pupils are equal, round, and reactive to light.  Cardiovascular: Normal rate and regular rhythm.  Respiratory: Effort normal.  Musculoskeletal:        General: Normal range of motion.     Cervical back: Normal range of motion.  Neurological: She is alert and oriented to person, place, and time.  Skin: Skin is warm and dry.  Psychiatric: Her speech is normal and behavior is normal. Judgment and thought content normal. Her mood appears anxious. Cognition and memory are normal. She exhibits a depressed mood.  Patient is displaying S+S of major depressive disorder.    Review of Systems  Psychiatric/Behavioral: Negative for confusion, dysphoric mood and suicidal ideas. The patient is nervous/anxious.   All other systems reviewed and are negative.   Blood pressure (!) 192/77, pulse (!) 58, resp. rate 13, height 5\' 4"  (1.626 m), weight 79.8 kg, SpO2 95 %.Body mass index is 30.21 kg/m.  General Appearance: Casual  Eye Contact:  Good  Speech:  Clear and Coherent  Volume:  Normal  Mood:  Depressed and Hopeless  Affect:  Congruent  Thought Process:  Coherent and Descriptions of Associations: Intact  Orientation:  Full (Time, Place, and Person)  Thought Content:  WDL  Suicidal Thoughts:  No  Homicidal Thoughts:  No  Memory:  Immediate;   Fair  Judgement:  Fair  Insight:  Lacking  Psychomotor Activity:  Normal  Concentration:  Concentration: Fair  Recall:  of Knowledge:  Fair  Language:  Fair  Akathisia:  NA   Handed:  Right  AIMS (if indicated):     Assets:  Communication Skills Desire for Improvement Financial Resources/Insurance  ADL's:  Intact  Cognition:  WNL  Sleep:   Barely sleeping      Treatment Plan Summary: Daily contact with patient to assess and evaluate symptoms and progress in treatment, Medication management and Plan Inpatient psychiatric hospitalization  Disposition: Recommend psychiatric Inpatient admission when medically cleared. Supportive therapy provided about ongoing stressors.  [employer], NP 02/16/2019 12:01 AM

## 2019-02-15 NOTE — ED Provider Notes (Signed)
[facility] Emergency Department Provider Note  Time seen: 5:32 PM  I have reviewed the triage vital signs and the nursing notes.   HISTORY  Chief Complaint Abdominal Pain and Chest Pain   HPI Kristina Wagner is a 85 y.o. female with a past medical history of anxiety, CHF, depression, diabetes, gastric reflux, hypertension, hyperlipidemia, presents to the emergency department for abdominal discomfort and depression.   Patient states for the past several weeks she has been experiencing a burning sensation in her stomach.  States this all started when she took Augmentin for a sinus infection approximately 1 month ago.  Patient states the burning has been ongoing although mild.  She states a bigger concern of hers that she feels like she has been experiencing worsening depression since her husband died 3 years ago both of her dogs have died over the past year.  States she lives alone she is seeing someone at [facility] and they have decreased her anxiety medication, states she cannot sleep because her thoughts are running through her head.  States she did not sleep at all last night.  Past Medical History:  Diagnosis Date  . Allergy   . Anxiety   . Arthritis    ankles, knees  . Atrial fibrillation (HCC)   . CHF (congestive heart failure) (HCC)    pt denies  . Depression   . DM (diabetes mellitus), type 2 (HCC)   . GERD (gastroesophageal reflux disease)   . HOH (hard of hearing)   . Hyperlipidemia   . Hypertension   . Thyroid nodule   . Wears dentures    Partial lower, full upper    Patient Active Problem List   Diagnosis Date Noted  . Recurrent major depressive disorder, in partial remission (HCC) 01/29/2018  . Diabetes mellitus without complication (HCC) 01/29/2018  . Gastroesophageal reflux disease 01/29/2018  . Primary osteoarthritis of right hip 12/12/2017  . Obesity (BMI 30.0-34.9) 10/27/2016  . Primary osteoarthritis of right knee 10/27/2016  .  Essential hypertension 10/19/2016  . Thyromegaly 12/22/2015  . Hyperlipidemia 10/13/2015  . Diastolic dysfunction 01/07/2014  . Anxiety and depression 03/06/2011  . Chronic insomnia 03/06/2011  . Degenerative joint disease, right, ankle 03/06/2011  . Dyslipidemia 03/06/2011  . History of tobacco abuse 03/06/2011  . PAF (paroxysmal atrial fibrillation) (HCC) 03/06/2011  . Thyroid nodule 03/06/2011  . Vertigo 03/06/2011    Past Surgical History:  Procedure Laterality Date  . CATARACT EXTRACTION W/PHACO Left 09/12/2017   Procedure: CATARACT EXTRACTION PHACO AND INTRAOCULAR LENS PLACEMENT (IOC) LEFT DIABETES;  Surgeon: Lockie Mola, MD;  Location: [facility];  Service: Ophthalmology;  Laterality: Left;  Diabetic - oral meds  . COLONOSCOPY  2006   had 2 polyps- benign    Prior to Admission medications   Medication Sig Start Date End Date Taking? Authorizing Provider  ALPRAZolam (XANAX) 0.25 MG tablet Take 0.25 mg by mouth 2 (two) times daily. [facility] 10/26/15   [provider]  Cyanocobalamin (VITAMIN B12 PO) Take 1 tablet by mouth daily.     [provider]  diltiazem (CARDIZEM CD) 120 MG 24 hr capsule Take 1 capsule (120 mg total) by mouth daily. 12/05/18   Duanne Limerick, MD  furosemide (LASIX) 20 MG tablet Take 1 tablet (20 mg total) by mouth daily as needed. 01/02/19   Duanne Limerick, MD  glipiZIDE (GLUCOTROL) 10 MG tablet Take 1 tablet (10 mg total) by mouth 2 (two) times daily before a meal. 12/31/18  Juline Patch, MD  glucose blood (ONETOUCH VERIO) test strip USE 1 STRIP TO CHECK GLUCOSE ONCE DAILY 10/21/18   Juline Patch, MD  hydrALAZINE (APRESOLINE) 25 MG tablet Take 1 tablet (25 mg total) by mouth 2 (two) times daily. 08/21/18   Juline Patch, MD  lansoprazole (PREVACID) 15 MG capsule Take 1 capsule (15 mg total) by mouth daily as needed. 08/21/18   Juline Patch, MD  metFORMIN (GLUCOPHAGE-XR) 500 MG 24 hr tablet Take 1 tablet (500 mg total)  by mouth daily. 12/31/18   Juline Patch, MD  metoprolol tartrate (LOPRESSOR) 100 MG tablet Take 1 tablet (100 mg total) by mouth 2 (two) times daily. 08/21/18   Juline Patch, MD  Multiple Vitamins-Minerals (MULTIVITAL PO) Take 1 tablet by mouth daily.    [provider]  Omega-3 Fatty Acids (FISH OIL) 1200 MG CAPS Take 1 capsule (1,200 mg total) by mouth daily. 01/29/18   Juline Patch, MD  rivaroxaban (XARELTO) 20 MG TABS tablet Take 1 tablet by mouth daily. Dr Posey Pronto 09/01/14   [provider]  simvastatin (ZOCOR) 40 MG tablet Take 1 tablet (40 mg total) by mouth daily. 08/21/18   Juline Patch, MD  valsartan (DIOVAN) 320 MG tablet Take 1 tablet (320 mg total) by mouth daily. 12/31/18   Juline Patch, MD  venlafaxine XR (EFFEXOR-XR) 150 MG 24 hr capsule Take 1 capsule (150 mg total) by mouth daily with breakfast. 08/21/18   Juline Patch, MD    No Known Allergies  Family History  Problem Relation Age of Onset  . Colon cancer Father   . Heart disease Brother   . Diabetes Sister     Social History Social History   Tobacco Use  . Smoking status: Former Smoker    Packs/day: 1.00    Years: 15.00    Pack years: 15.00    Types: Cigarettes    Quit date: 01/17/1983    Years since quitting: 36.1  . Smokeless tobacco: Never Used  Substance Use Topics  . Alcohol use: No    Alcohol/week: 0.0 standard drinks  . Drug use: No    Review of Systems Constitutional: Negative for fever Cardiovascular: Negative for chest pain. Respiratory: Negative for shortness of breath. Gastrointestinal: Mild abdominal burning.  Negative for vomiting or diarrhea. Genitourinary: Negative for urinary compaints Musculoskeletal: Negative for musculoskeletal complaints Neurological: Negative for headache All other ROS negative  ____________________________________________   PHYSICAL EXAM:  VITAL SIGNS: ED Triage Vitals  Enc Vitals Group     BP 02/15/19 1522 (!) 161/76     Pulse  Rate 02/15/19 1522 (!) 59     Resp 02/15/19 1522 16     Temp --      Temp src --      SpO2 02/15/19 1522 98 %     Weight 02/15/19 1523 176 lb (79.8 kg)     Height 02/15/19 1523 5\' 4"  (1.626 m)     Head Circumference --      Peak Flow --      Pain Score 02/15/19 1523 2     Pain Loc --      Pain Edu? --      Excl. in [place]? --     Constitutional: Alert and oriented. Well appearing and in no distress. Eyes: Normal exam ENT      Head: Normocephalic and atraumatic.      Mouth/Throat: Mucous membranes are moist. Cardiovascular: Normal rate, regular rhythm.  Respiratory: Normal respiratory effort without tachypnea nor retractions. Breath sounds are clear  Gastrointestinal: Soft and nontender. No distention.  Musculoskeletal: Nontender with normal range of motion in all extremities.  Neurologic:  Normal speech and language. No gross focal neurologic deficits  Skin:  Skin is warm, dry and intact.  Psychiatric: Mood and affect are normal.  ____________________________________________    EKG  EKG viewed and interpreted by myself shows a sinus rhythm at 61 bpm with a narrow QRS, normal axis, normal intervals, nonspecific but no concerning ST changes.  ____________________________________________    RADIOLOGY  Chest x-ray negative  ____________________________________________   INITIAL IMPRESSION / ASSESSMENT AND PLAN / ED COURSE  Pertinent labs & imaging results that were available during my care of the patient were reviewed by me and considered in my medical decision making (see chart for details).   Patient presents to the emergency department for mild abdominal burning which has been ongoing for the last several weeks.  More concerning however the patient appears to have worsening depression, has not been sleeping and has worsening anxiety.  Offered to have the patient speak to a psychiatrist, she agrees.  We will dose a GI cocktail for her GI symptoms which could possibly be  related to reflux or gastritis.  Her basic lab works are reassuring but I have added on a hepatic function panel and lipase to further evaluate.  Reassuringly patient has a benign abdominal exam.  Patient has been seen by psychiatry, and I believe the patient would benefit from admission from a psychiatric standpoint.  Patient's lab work is largely nonrevealing from a medical standpoint.  Kristina Wagner was evaluated in Emergency Department on 02/15/2019 for the symptoms described in the history of present illness. She was evaluated in the context of the global COVID-19 pandemic, which necessitated consideration that the patient might be at risk for infection with the SARS-CoV-2 virus that causes COVID-19. Institutional protocols and algorithms that pertain to the evaluation of patients at risk for COVID-19 are in a state of rapid change based on information released by regulatory bodies including the CDC and federal and state organizations. These policies and algorithms were followed during the patient's care in the ED.  ____________________________________________   FINAL CLINICAL IMPRESSION(S) / ED DIAGNOSES  Abdominal pain Depression   Minna Antis, MD 02/15/19 2205

## 2019-02-15 NOTE — ED Notes (Signed)
Upon entering pt room, pt was resting in bed. Pt states that she wants her BP medications and this RN told her that once he medications are in the chart that I will provide her medications. Pt mentions how she lost both of her dogs and how upset it makes her because she use to sleep with both of them. Pt denies any further needs at this time.

## 2019-02-16 DIAGNOSIS — F329 Major depressive disorder, single episode, unspecified: Secondary | ICD-10-CM

## 2019-02-16 DIAGNOSIS — F3341 Major depressive disorder, recurrent, in partial remission: Secondary | ICD-10-CM

## 2019-02-16 DIAGNOSIS — F332 Major depressive disorder, recurrent severe without psychotic features: Secondary | ICD-10-CM

## 2019-02-16 MED ORDER — METFORMIN HCL ER 500 MG PO TB24
500.0000 mg | ORAL_TABLET | Freq: Every day | ORAL | Status: DC
  Filled 2019-02-16: qty 1

## 2019-02-16 MED ORDER — VENLAFAXINE HCL ER 150 MG PO CP24: 150.0000 mg | ORAL_CAPSULE | Freq: Every day | ORAL | Status: DC

## 2019-02-16 MED ORDER — FUROSEMIDE 40 MG PO TABS: 20.0000 mg | ORAL_TABLET | Freq: Every day | ORAL | Status: DC

## 2019-02-16 MED ORDER — OMEGA-3-ACID ETHYL ESTERS 1 G PO CAPS
1.0000 g | ORAL_CAPSULE | Freq: Every day | ORAL | Status: DC
  Administered 2019-02-16: 1 g via ORAL
  Filled 2019-02-16: qty 1

## 2019-02-16 MED ORDER — METOPROLOL TARTRATE 50 MG PO TABS: 100.0000 mg | ORAL_TABLET | Freq: Two times a day (BID) | ORAL | Status: DC

## 2019-02-16 MED ORDER — SIMVASTATIN 10 MG PO TABS: 40.0000 mg | ORAL_TABLET | Freq: Every day | ORAL | Status: DC

## 2019-02-16 MED ORDER — HYDRALAZINE HCL 50 MG PO TABS: 25.0000 mg | ORAL_TABLET | Freq: Two times a day (BID) | ORAL | Status: DC

## 2019-02-16 MED ORDER — LORAZEPAM 0.5 MG PO TABS
0.5000 mg | ORAL_TABLET | Freq: Two times a day (BID) | ORAL | Status: DC
  Administered 2019-02-16: 0.5 mg via ORAL
  Filled 2019-02-16: qty 1

## 2019-02-16 MED ORDER — CITALOPRAM HYDROBROMIDE 20 MG PO TABS: 20.0000 mg | ORAL_TABLET | Freq: Every day | ORAL | 2 refills | Status: AC

## 2019-02-16 MED ORDER — PANTOPRAZOLE SODIUM 20 MG PO TBEC
20.0000 mg | DELAYED_RELEASE_TABLET | Freq: Every day | ORAL | Status: DC
  Administered 2019-02-16: 20 mg via ORAL
  Filled 2019-02-16: qty 1

## 2019-02-16 MED ORDER — CITALOPRAM HYDROBROMIDE 20 MG PO TABS: 20.0000 mg | ORAL_TABLET | Freq: Every day | ORAL | Status: DC

## 2019-02-16 MED ORDER — GLIPIZIDE 10 MG PO TABS
10.0000 mg | ORAL_TABLET | Freq: Two times a day (BID) | ORAL | Status: DC
  Filled 2019-02-16: qty 1

## 2019-02-16 MED ORDER — DILTIAZEM HCL ER COATED BEADS 120 MG PO CP24
120.0000 mg | ORAL_CAPSULE | Freq: Every day | ORAL | Status: DC
  Filled 2019-02-16: qty 1

## 2019-02-16 MED ORDER — LORAZEPAM 0.5 MG PO TABS: 0.5000 mg | ORAL_TABLET | Freq: Two times a day (BID) | ORAL | 0 refills | Status: AC | PRN

## 2019-02-16 MED ORDER — FISH OIL 1200 MG PO CAPS: 1.0000 | ORAL_CAPSULE | Freq: Every day | ORAL | Status: DC

## 2019-02-16 MED ORDER — IRBESARTAN 150 MG PO TABS
300.0000 mg | ORAL_TABLET | Freq: Every day | ORAL | Status: DC
  Filled 2019-02-16: qty 2

## 2019-02-16 NOTE — ED Notes (Signed)
PT given belongings to contact family for transportation upon discharge.

## 2019-02-16 NOTE — ED Provider Notes (Signed)
-----------------------------------------   6:37 AM on 02/16/2019 -----------------------------------------   Blood pressure (!) 131/44, pulse 65, resp. rate 16, height 5\' 4"  (1.626 m), weight 79.8 kg, SpO2 100 %.  The patient is sleeping at this time.  There have been no acute events since the last update.  Awaiting disposition plan from Behavioral Medicine and/or Social Work team(s).   , MD 02/16/19 [phone]

## 2019-02-16 NOTE — ED Notes (Signed)
Admitting provider contacted to place dietary order for this pt

## 2019-02-16 NOTE — Discharge Instructions (Signed)

## 2019-02-16 NOTE — ED Notes (Signed)
Pt given meal tray.

## 2019-02-16 NOTE — ED Notes (Signed)
This RN spoke with pt's sister-n-law, Bonita Quin [phone], who agreed to provide transportation for pt upon discharge.

## 2019-02-16 NOTE — ED Notes (Signed)
Psychiatrist at bedside

## 2019-02-16 NOTE — ED Notes (Addendum)
Pt given crackers and water. 

## 2019-02-16 NOTE — ED Notes (Signed)
This RN spoke with Dr. Colon Branch and Asher Muir, Psychiatrist for reevaluation of pt.

## 2019-02-16 NOTE — Consult Note (Signed)
[facility] Psych ED Discharge  02/16/2019 2:00 PM Kristina Wagner  MRN:  [id number] Principal Problem: <principal problem not specified> Discharge Diagnoses: Active Problems:   Recurrent major depressive disorder, in partial remission (HCC)   MDD (major depressive disorder)  Subjective: "They misunderstood me and got out of hand."  Patient seen and evaluated in person by this provider.  She came to the emergency department because of abdominal pain and reports she told the doctor yes when he asked if she had depression.  Then she says that at out of hand, " I had a more hurt myself.  I am a [place] woman and would never do that."  She talks about her recent purchase of a trailer and a really nice trailer park in [place] to be close to her 2 sons her 2 granddaughters and her great granddaughter and great grandson.  Her husband passed 3 years ago and her 2 little dogs over the past year.  She has been suffering from gastritis since December which is caused her to sleep more than usual at times.  Recently, however she has not been able to sleep well.  Denies suicidal/homicidal ideations, hallucinations, and substance abuse.  She did give permission to talk to her daughter-in-law who thought she was going to go inpatient for 7 days and was concerned about her sleeping "too much".  The patient reports this was over the holidays when she was visiting there and did not feel well.  The daughter-in-law says they will come and move her this Friday to [place].  The patient is stable and has future goals, wishes to leave.  Psychiatrically stable at this time to return home.  She called family who came to get her.  HPI per TTS:   Kristina Metheney Rowlandis an 85 y.o.femal[facility] for intermittent abdominal pain and upset stomach for 3 weeks. Patient's nurse documented that patient reported having feelings of depression and no support system at home and EDP reported that patient has been  experiencing worsening depression since her husband died 3 years ago and both of her dogs have died over the past year.States she lives alone she is seeing someone at [facility] and they have decreased her anxiety medication, states she cannot sleep because her thoughts are running through her head. States she did not sleep at all last night. During assessment patient was alert and oriented x4, pleasant and cooperative. Patient reported "when I do sleep it's like my mind has been going ." Patient continued to endorsee depression symptoms "I don't eat, I don't get off the couch, I'll sit in my chair all day and do nothing, I don't shower, I'll shower maybe once a week, I had to hire a person to come to my house to clean it because I just don't have the motivation to do it, I'll pay my bills but I don't want to." Patient reported "I'm so down all the time, nothing makes me happy." Patient reported that she is engaged with outpatient services at [facility] and sees her psychiatrist Dr. Flavia Shipper "once every 3 months." Patient reported that although she is prescribed Abilify she does not take it "it makes me sleep too much." Patient denies current SI/HI/AH/VH and does not appear to be responding to any internal or external stimuli  Total Time spent with patient: 30 minutes  Past Psychiatric History: depression and anxiety  Past Medical History:  Past Medical History:  Diagnosis Date  . Allergy   . Anxiety   . Arthritis  ankles, knees  . Atrial fibrillation (HCC)   . CHF (congestive heart failure) (HCC)    pt denies  . Depression   . DM (diabetes mellitus), type 2 (HCC)   . GERD (gastroesophageal reflux disease)   . HOH (hard of hearing)   . Hyperlipidemia   . Hypertension   . Thyroid nodule   . Wears dentures    Partial lower, full upper    Past Surgical History:  Procedure Laterality Date  . CATARACT EXTRACTION W/PHACO Left 09/12/2017   Procedure: CATARACT EXTRACTION PHACO AND INTRAOCULAR LENS  PLACEMENT (IOC) LEFT DIABETES;  Surgeon: Lockie Mola, MD;  Location: [facility];  Service: Ophthalmology;  Laterality: Left;  Diabetic - oral meds  . COLONOSCOPY  2006   had 2 polyps- benign   Family History:  Family History  Problem Relation Age of Onset  . Colon cancer Father   . Heart disease Brother   . Diabetes Sister    Family Psychiatric  History: none Social History:  Social History   Substance and Sexual Activity  Alcohol Use No  . Alcohol/week: 0.0 standard drinks     Social History   Substance and Sexual Activity  Drug Use No    Social History   Socioeconomic History  . Marital status: Widowed    Spouse name: Not on file  . Number of children: 2  . Years of education: Not on file  . Highest education level: Not on file  Occupational History  . Not on file  Tobacco Use  . Smoking status: Former Smoker    Packs/day: 1.00    Years: 15.00    Pack years: 15.00    Types: Cigarettes    Quit date: 01/17/1983    Years since quitting: 36.1  . Smokeless tobacco: Never Used  Substance and Sexual Activity  . Alcohol use: No    Alcohol/week: 0.0 standard drinks  . Drug use: No  . Sexual activity: Never  Other Topics Concern  . Not on file  Social History Narrative  . Not on file   Social Determinants of Health   Financial Resource Strain: Low Risk   . Difficulty of Paying Living Expenses: Not hard at all  Food Insecurity: No Food Insecurity  . Worried About Programme researcher, broadcasting/film/video in the Last Year: Never true  . Ran Out of Food in the Last Year: Never true  Transportation Needs: No Transportation Needs  . Lack of Transportation (Medical): No  . Lack of Transportation (Non-Medical): No  Physical Activity: Inactive  . Days of Exercise per Week: 0 days  . Minutes of Exercise per Session: 0 min  Stress: Stress Concern Present  . Feeling of Stress : To some extent  Social Connections: Unknown  . Frequency of Communication with Friends and  Family: Patient refused  . Frequency of Social Gatherings with Friends and Family: Patient refused  . Attends Religious Services: Patient refused  . Active Member of Clubs or Organizations: Patient refused  . Attends Banker Meetings: Patient refused  . Marital Status: Widowed    Has this patient used any form of tobacco in the last 30 days? (Cigarettes, Smokeless Tobacco, Cigars, and/or Pipes) None Current Medications: Current Facility-Administered Medications  Medication Dose Route Frequency Provider Last Rate Last Admin  . citalopram (CELEXA) tablet 20 mg  20 mg Oral QHS Charm Rings, NP      . glipiZIDE (GLUCOTROL) tablet 10 mg  10 mg Oral BID AC Charm Rings,  NP      . LORazepam (ATIVAN) tablet 0.5 mg  0.5 mg Oral BID Charm Rings, NP   0.5 mg at 02/16/19 1158  . metFORMIN (GLUCOPHAGE-XR) 24 hr tablet 500 mg  500 mg Oral Q supper Charm Rings, NP      . omega-3 acid ethyl esters (LOVAZA) capsule 1 g  1 g Oral Daily Tressie Ellis, RPH   1 g at 02/16/19 1157  . pantoprazole (PROTONIX) EC tablet 20 mg  20 mg Oral Daily Charm Rings, NP   20 mg at 02/16/19 1157  . simvastatin (ZOCOR) tablet 40 mg  40 mg Oral q1800 Tressie Ellis, [facility]       Current Outpatient Medications  Medication Sig Dispense Refill  . ALPRAZolam (XANAX) 0.25 MG tablet Take 0.25 mg by mouth 2 (two) times daily. [facility]    . Cyanocobalamin (VITAMIN B12 PO) Take 1 tablet by mouth daily.     Marland Kitchen diltiazem (CARDIZEM CD) 120 MG 24 hr capsule Take 1 capsule (120 mg total) by mouth daily. 90 capsule 0  . furosemide (LASIX) 20 MG tablet Take 1 tablet (20 mg total) by mouth daily as needed. (Patient taking differently: Take 20 mg by mouth daily. ) 90 tablet 0  . glipiZIDE (GLUCOTROL) 10 MG tablet Take 1 tablet (10 mg total) by mouth 2 (two) times daily before a meal. 60 tablet 1  . glucose blood (ONETOUCH VERIO) test strip USE 1 STRIP TO CHECK GLUCOSE ONCE DAILY 50 each 2  . hydrALAZINE (APRESOLINE)  25 MG tablet Take 1 tablet (25 mg total) by mouth 2 (two) times daily. 180 tablet 1  . lansoprazole (PREVACID) 15 MG capsule Take 1 capsule (15 mg total) by mouth daily as needed. (Patient taking differently: Take 15 mg by mouth daily. ) 90 capsule 1  . metFORMIN (GLUCOPHAGE-XR) 500 MG 24 hr tablet Take 1 tablet (500 mg total) by mouth daily. 30 tablet 1  . metoprolol tartrate (LOPRESSOR) 100 MG tablet Take 1 tablet (100 mg total) by mouth 2 (two) times daily. 180 tablet 1  . Multiple Vitamins-Minerals (MULTIVITAL PO) Take 1 tablet by mouth daily.    . Omega-3 Fatty Acids (FISH OIL) 1200 MG CAPS Take 1 capsule (1,200 mg total) by mouth daily. 30 capsule 11  . rivaroxaban (XARELTO) 20 MG TABS tablet Take 1 tablet by mouth daily. Dr Allena Katz    . simvastatin (ZOCOR) 40 MG tablet Take 1 tablet (40 mg total) by mouth daily. 90 tablet 1  . valsartan (DIOVAN) 320 MG tablet Take 1 tablet (320 mg total) by mouth daily. 90 tablet 0  . venlafaxine XR (EFFEXOR-XR) 150 MG 24 hr capsule Take 1 capsule (150 mg total) by mouth daily with breakfast. 90 capsule 1   PTA Medications: (Not in a hospital admission)   Musculoskeletal: Strength & Muscle Tone: within normal limits Gait & Station: normal Patient leans: N/A  Psychiatric Specialty Exam: Physical Exam Constitutional:      Appearance: She is well-developed.  HENT:     Head: Normocephalic.  Pulmonary:     Effort: Pulmonary effort is normal.  Neurological:     General: No focal deficit present.     Mental Status: She is alert and oriented to person, place, and time.  Psychiatric:        Attention and Perception: Attention and perception normal.        Mood and Affect: Mood is anxious and depressed.  Speech: Speech normal.        Behavior: Behavior normal. Behavior is cooperative.        Thought Content: Thought content normal.        Cognition and Memory: Cognition and memory normal.        Judgment: Judgment normal.     Review of  Systems  Psychiatric/Behavioral: Positive for dysphoric mood. The patient is nervous/anxious.   All other systems reviewed and are negative.   Blood pressure (!) 128/55, pulse 70, resp. rate 15, height 5\' 4"  (1.626 m), weight 79.8 kg, SpO2 98 %.Body mass index is 30.21 kg/m.  General Appearance: Casual  Eye Contact:  Good  Speech:  Normal Rate  Volume:  Normal  Mood:  Anxious and Depressed  Affect:  Congruent  Thought Process:  Coherent and Descriptions of Associations: Intact  Orientation:  Full (Time, Place, and Person)  Thought Content:  WDL and Logical  Suicidal Thoughts:  No  Homicidal Thoughts:  No  Memory:  Immediate;   Good Recent;   Good Remote;   Good  Judgement:  Good  Insight:  Good  Psychomotor Activity:  Normal  Concentration:  Concentration: Good and Attention Span: Good  Recall:  Good  Fund of Knowledge:  Good  Language:  Good  Akathisia:  No  Handed:  Right  AIMS (if indicated):     Assets:  Housing Leisure Time Physical Health Resilience Social Support  ADL's:  Intact  Cognition:  WNL  Sleep:        Demographic Factors:  Age 10 or older, Caucasian and Living alone  Loss Factors: NA  Historical Factors: NA  Risk Reduction Factors:   Sense of responsibility to family, Positive social support and Positive therapeutic relationship  Continued Clinical Symptoms:  Depression and anxiety, mild to moderate  Cognitive Features That Contribute To Risk:  None    Suicide Risk:  Minimal: No identifiable suicidal ideation.  Patients presenting with no risk factors but with morbid ruminations; may be classified as minimal risk based on the severity of the depressive symptoms    Plan Of Care/Follow-up recommendations:  Major depressive disorder, recurrent, moderate: -Started Celexa 20 mg daily  Anxiety: -Discontinue Xanax 0.25 twice daily as needed -Start Ativan 0.5 mg twice daily as needed Activity:  as tolerated Diet:  heart healthy  diet  Disposition: discharge home Waylan Boga, NP 02/16/2019, 2:00 PM

## 2019-02-16 NOTE — ED Notes (Signed)
Pt ambulatory to bathroom with assistance from this RN.

## 2019-02-16 NOTE — BH Assessment (Signed)
Late Entry- Writer spoke with patient to complete updated/reassessment. Patient denies SI/HI and AV/H. She admits to having depression and further reports, what she shared with ER staff about SI was a misunderstanding. Patient states she want to go home. While talking with the patient, she provided protective factors without been asked. She recently purchased a mobile home in [place], [place] to moved there and be closer with her family. She also reports of she having support/friends in the [facility] and they come to her home and help and check on her. Her religious beliefs also prevent her from self-harm but she made it clear, "I don't want to do anything to hurt myself anyway."  With the permission of the patient, Clinical research associate spoke with patient's daughter-n-law/POA Bonita Quin [phone]).  She shared the patient has depression and noticed some changes with her. However, she has no concerns about the patient harming herself or anyone else.

## 2019-02-16 NOTE — ED Notes (Addendum)
This RN introduced self to pt. Pt stating she is ready to leave she does not know why they took her clothes and personal belongings away from her. Pt stating she is disappointed with care she has received and has not received any medicine since she got here. This RN assured pt that from the conversation she had with the MD and psychiatrist their number one priority is her safety upon discharge. This RN also assured pt her medicine would be verified and given. MD made aware.

## 2019-02-16 NOTE — ED Provider Notes (Signed)
2:29 PM Patient has been seen and evaluated by psychiatry team and deemed appropriate for discharge. Patient determined not to be a threat to themselves or others. IVC rescinded by psychiatry team. Patient is otherwise medically clear.   Will plan for discharge with outpatient follow up.     Miguel Aschoff., MD 02/16/19 [phone]

## 2019-02-16 NOTE — ED Notes (Signed)
Pt resting in bed. Respirations are equal and unlabored. No signs of acute distress.

## 2019-03-07 ENCOUNTER — Other Ambulatory Visit: Payer: Self-pay

## 2019-03-07 DIAGNOSIS — I10 Essential (primary) hypertension: Secondary | ICD-10-CM

## 2019-03-07 DIAGNOSIS — E119 Type 2 diabetes mellitus without complications: Secondary | ICD-10-CM

## 2019-03-07 MED ORDER — DILTIAZEM HCL ER COATED BEADS 120 MG PO CP24: 120.0000 mg | ORAL_CAPSULE | Freq: Every day | ORAL | 0 refills | Status: DC

## 2019-03-07 MED ORDER — ONETOUCH VERIO VI STRP: ORAL_STRIP | 0 refills | Status: DC

## 2019-03-07 MED ORDER — GLIPIZIDE 10 MG PO TABS: 10.0000 mg | ORAL_TABLET | Freq: Two times a day (BID) | ORAL | 0 refills | Status: AC

## 2019-03-07 NOTE — Progress Notes (Unsigned)
Sent in meds to [facility]

## 2019-03-14 ENCOUNTER — Other Ambulatory Visit: Payer: Self-pay

## 2019-03-14 DIAGNOSIS — I10 Essential (primary) hypertension: Secondary | ICD-10-CM

## 2019-03-14 DIAGNOSIS — E119 Type 2 diabetes mellitus without complications: Secondary | ICD-10-CM

## 2019-03-14 MED ORDER — DILTIAZEM HCL ER COATED BEADS 120 MG PO CP24: 120.0000 mg | ORAL_CAPSULE | Freq: Every day | ORAL | 0 refills | Status: AC

## 2019-03-14 MED ORDER — ONETOUCH VERIO VI STRP: ORAL_STRIP | 1 refills | Status: DC

## 2019-03-14 NOTE — Progress Notes (Unsigned)
Sent in Diltiazem and strips to [facility] in [place]

## 2019-04-04 ENCOUNTER — Ambulatory Visit: Payer: Medicare Other | Admitting: Family Medicine

## 2019-04-08 ENCOUNTER — Other Ambulatory Visit: Payer: Self-pay | Admitting: Family Medicine

## 2019-04-08 DIAGNOSIS — I10 Essential (primary) hypertension: Secondary | ICD-10-CM

## 2019-04-11 ENCOUNTER — Other Ambulatory Visit: Payer: Self-pay

## 2019-04-11 DIAGNOSIS — I10 Essential (primary) hypertension: Secondary | ICD-10-CM

## 2019-04-11 DIAGNOSIS — K219 Gastro-esophageal reflux disease without esophagitis: Secondary | ICD-10-CM

## 2019-04-11 MED ORDER — PANTOPRAZOLE SODIUM 20 MG PO TBEC: 20.0000 mg | DELAYED_RELEASE_TABLET | ORAL | 0 refills | Status: AC

## 2019-04-11 MED ORDER — METOPROLOL TARTRATE 100 MG PO TABS: 100.0000 mg | ORAL_TABLET | Freq: Two times a day (BID) | ORAL | 0 refills | Status: AC

## 2019-04-11 MED ORDER — VALSARTAN 320 MG PO TABS: 320.0000 mg | ORAL_TABLET | Freq: Every day | ORAL | 0 refills | Status: AC

## 2019-07-15 ENCOUNTER — Telehealth: Payer: Self-pay | Admitting: Family Medicine

## 2019-07-15 NOTE — Telephone Encounter (Signed)
Please remove [place] as pcp- pt moved to [facility]- we are no longer her provider

## 2019-07-15 NOTE — Telephone Encounter (Signed)
Medication Refill - Medication: pantoprazole (PROTONIX) 20 MG tablet   Has the patient contacted their pharmacy? Yes.   (Agent: If no, request that the patient contact the pharmacy for the refill.) (Agent: If yes, when and what did the pharmacy advise?)  Preferred Pharmacy (with phone number or street name):  [facility] [address] [place] - [address] Phone:  [phone]  Fax:  [phone]       Agent: Please be advised that RX refills may take up to 3 business days. We ask that you follow-up with your pharmacy.

## 2019-09-01 ENCOUNTER — Other Ambulatory Visit: Payer: Self-pay | Admitting: Family Medicine

## 2019-09-01 DIAGNOSIS — E119 Type 2 diabetes mellitus without complications: Secondary | ICD-10-CM

## 2019-10-21 ENCOUNTER — Other Ambulatory Visit: Payer: Self-pay | Admitting: Family Medicine

## 2019-10-21 DIAGNOSIS — E119 Type 2 diabetes mellitus without complications: Secondary | ICD-10-CM

## 2021-03-22 IMAGING — CR DG CHEST 2V
2 series · 2 of 2 positions shown · non-contrast
Comparison: 02/16/2017

CLINICAL DATA: Chest and epigastric pain for 3 weeks. Atrial
fibrillation. Congestive heart failure.

EXAM:
CHEST - 2 VIEW

[chest pa]
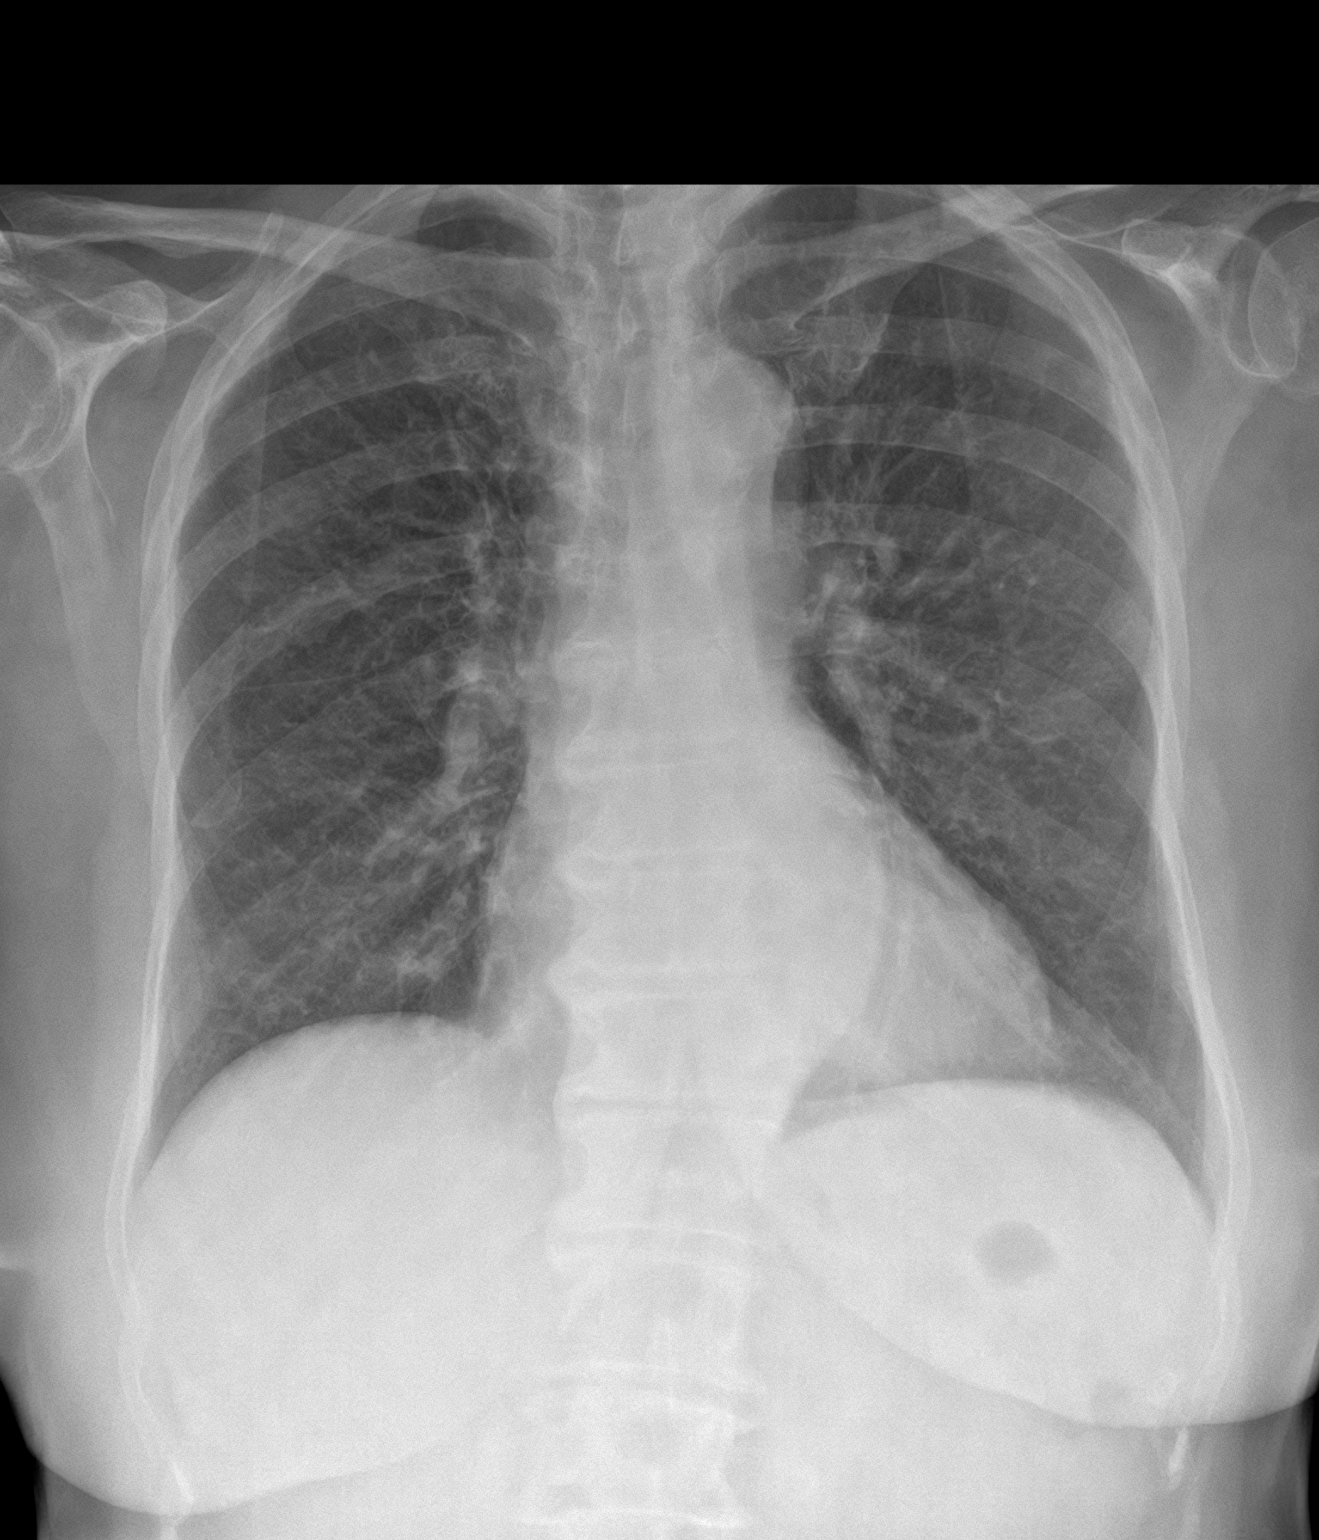

[chest lat]
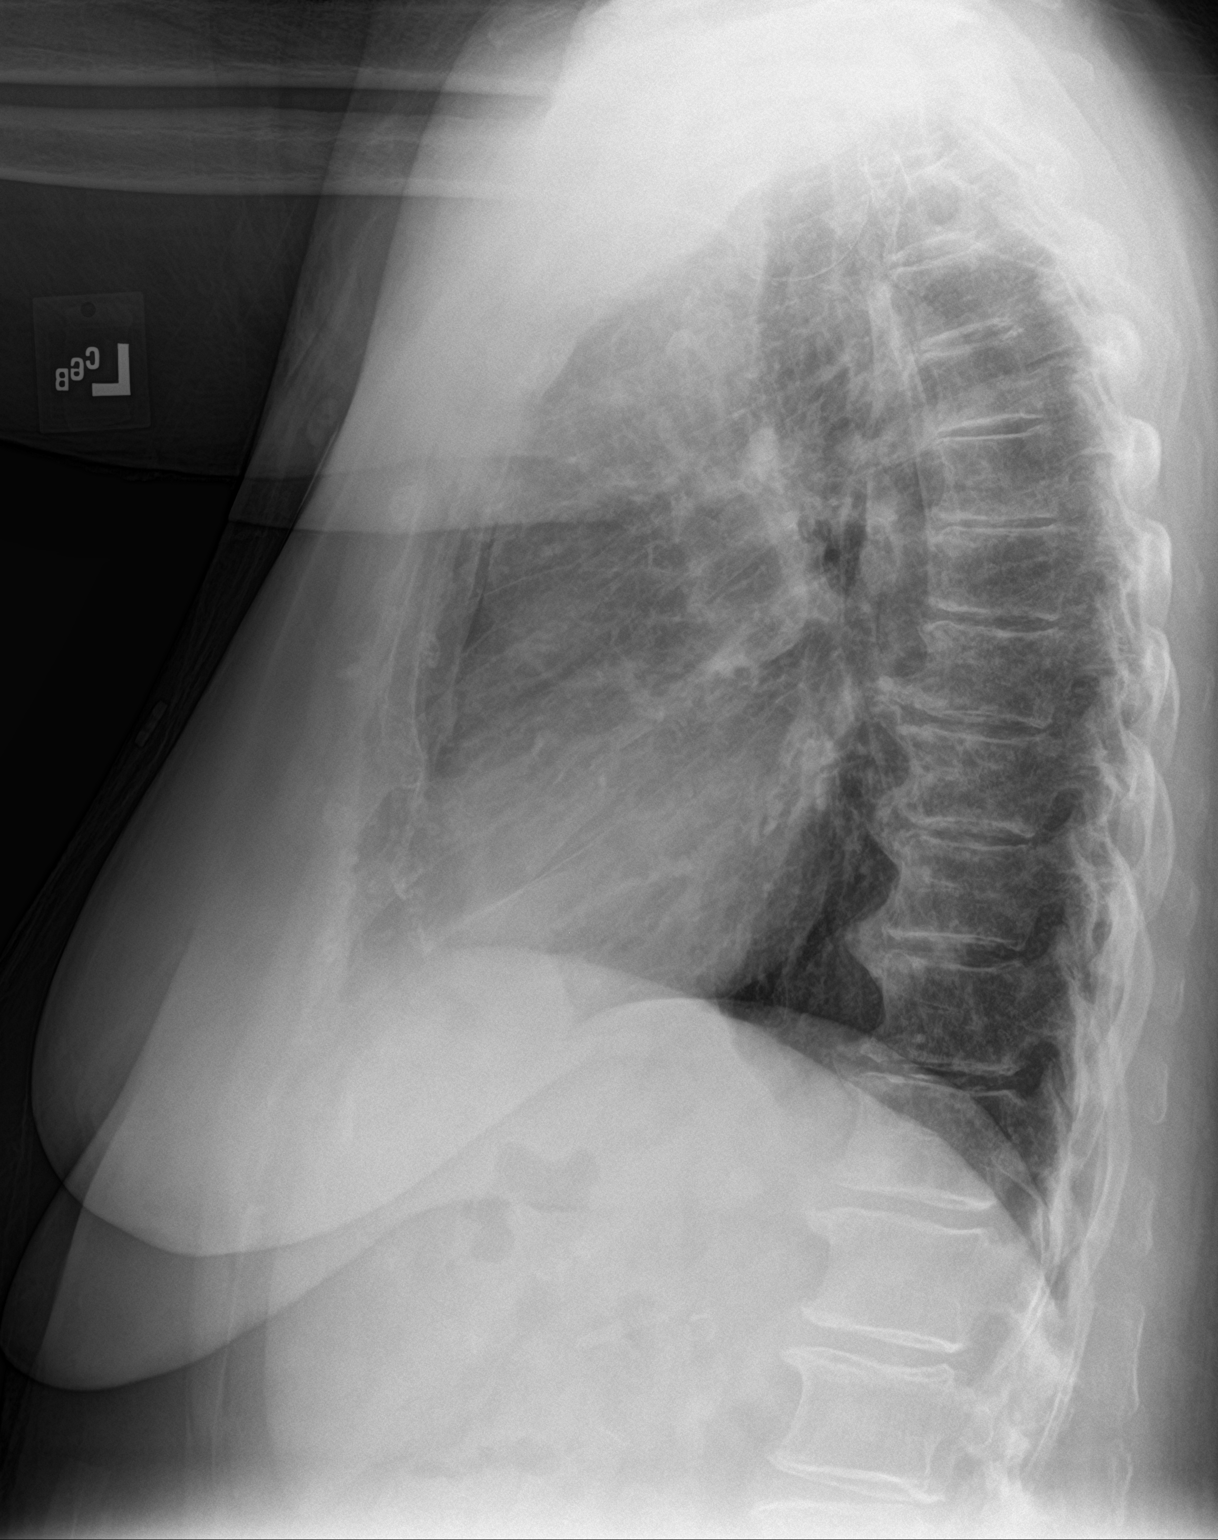

[2 of 2 positions shown; findings below may reference images not displayed]

FINDINGS: Heart size remains within normal limits. Stable tortuosity of
thoracic aorta. Both lungs are clear. No evidence of pneumothorax or
pleural effusion.
IMPRESSION: Stable exam.  No active cardiopulmonary disease.
